# Patient Record
Sex: Male | Born: 1935 | Race: Black or African American | Hispanic: No | Smoking: Never smoker
Health system: Southern US, Community
[De-identification: ages and names within clinical notes are randomized; demographics above are authoritative.]

## PROBLEM LIST (undated history)

## (undated) DIAGNOSIS — I10 Essential (primary) hypertension: Secondary | ICD-10-CM

## (undated) DIAGNOSIS — K219 Gastro-esophageal reflux disease without esophagitis: Secondary | ICD-10-CM

## (undated) DIAGNOSIS — E119 Type 2 diabetes mellitus without complications: Secondary | ICD-10-CM

## (undated) DIAGNOSIS — R011 Cardiac murmur, unspecified: Secondary | ICD-10-CM

## (undated) DIAGNOSIS — C61 Malignant neoplasm of prostate: Secondary | ICD-10-CM

## (undated) DIAGNOSIS — M1990 Unspecified osteoarthritis, unspecified site: Secondary | ICD-10-CM

## (undated) DIAGNOSIS — E7800 Pure hypercholesterolemia, unspecified: Secondary | ICD-10-CM

## (undated) HISTORY — DX: Unspecified osteoarthritis, unspecified site: M19.90

## (undated) HISTORY — PX: PROSTATE SURGERY: SHX751

## (undated) HISTORY — DX: Cardiac murmur, unspecified: R01.1

---

## 2004-08-24 ENCOUNTER — Ambulatory Visit: Payer: Self-pay | Admitting: Urology

## 2005-08-18 IMAGING — CT CT ABDOMEN AND PELVIS WITHOUT AND WITH CONTRAST
2 of 4 series · 14 of 32 positions shown, 19 images · non-contrast
Comparison: none

REASON FOR EXAM: Hematuria
COMMENTS:

[Series 4: with · axial · 0.65mm/px · z∈[-456,-136]mm · 8 of 52 slices shown, 13 images]
[im 6/52  soft-tissue]
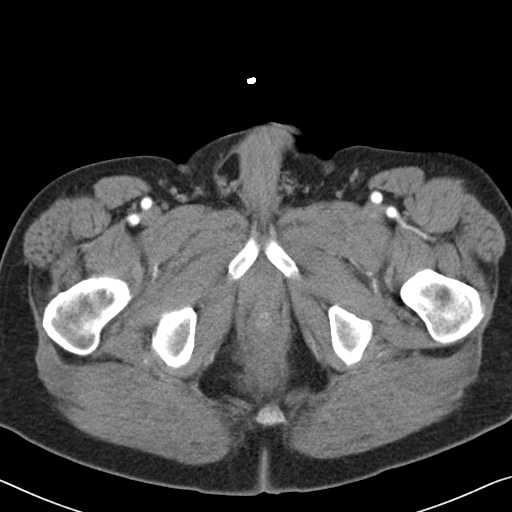
[im 6/52  bone]
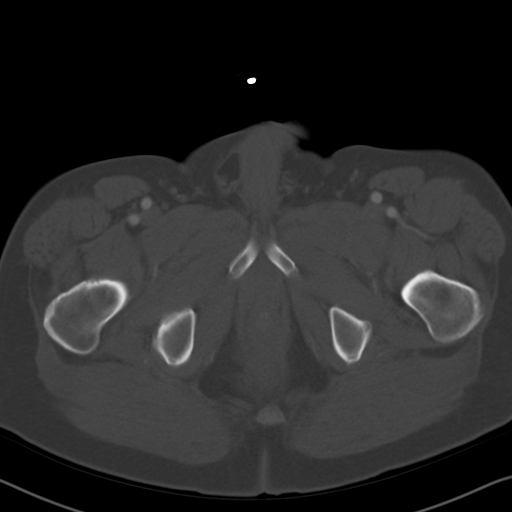
[im 12/52  soft-tissue]
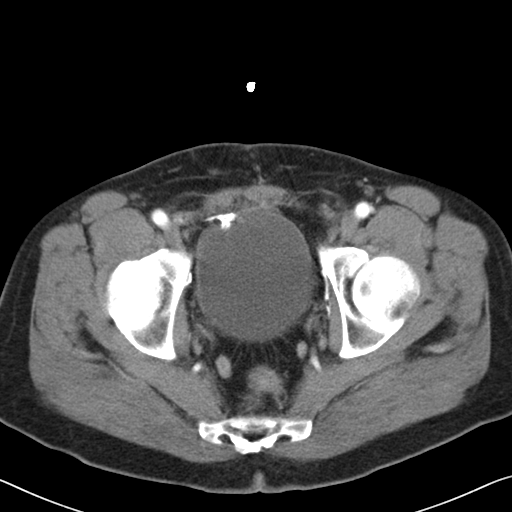
[im 18/52  soft-tissue]
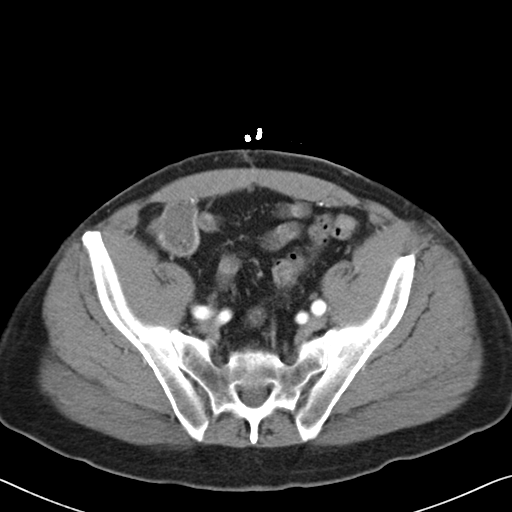
[im 23/52  soft-tissue]
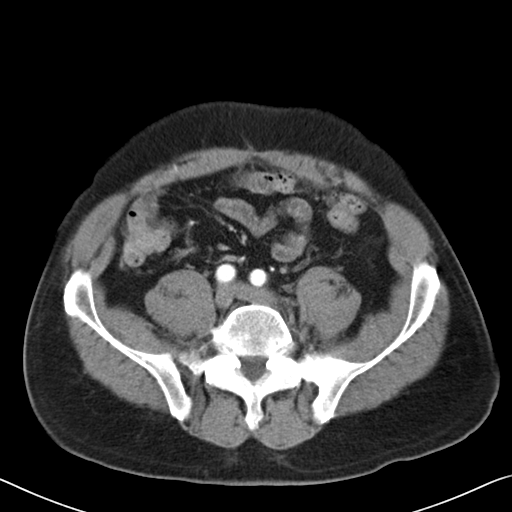
[im 29/52  soft-tissue]
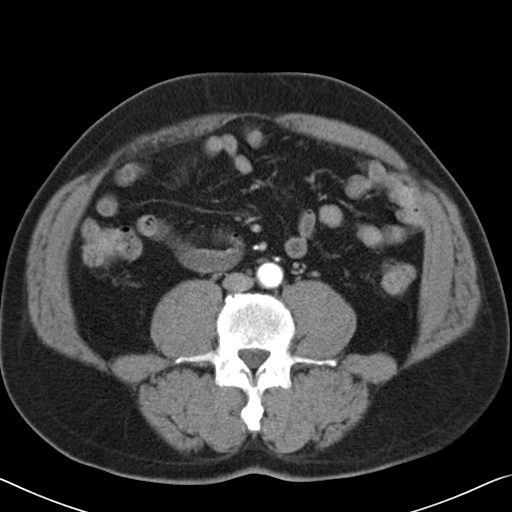
[im 29/52  lung]
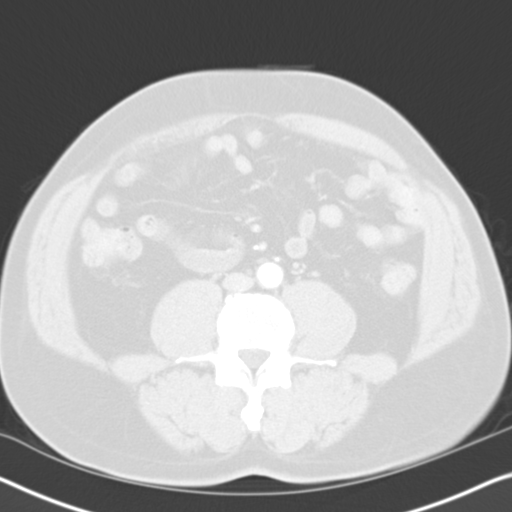
[im 35/52  soft-tissue]
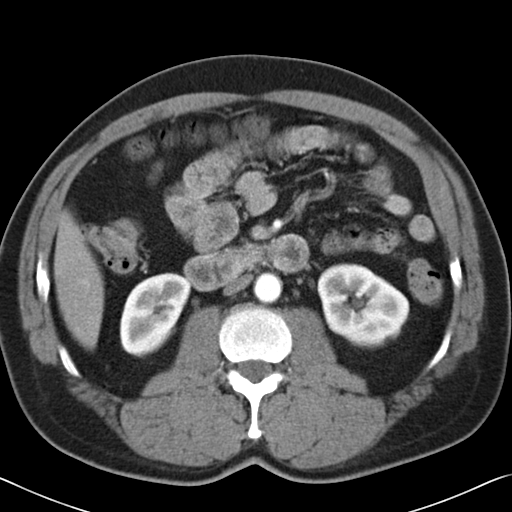
[im 35/52  lung]
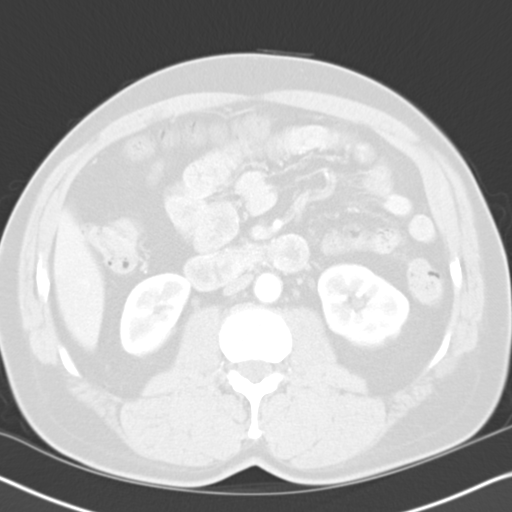
[im 40/52  soft-tissue]
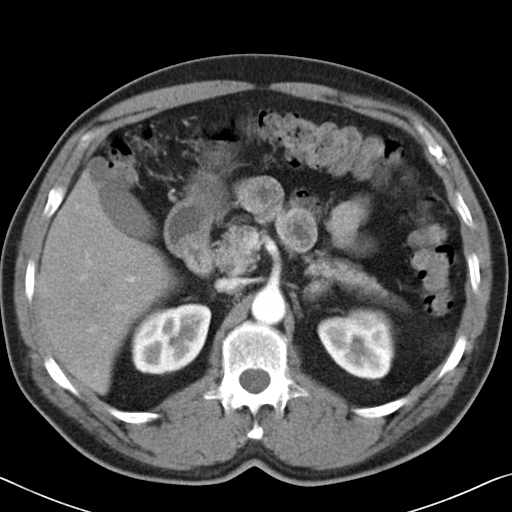
[im 40/52  lung]
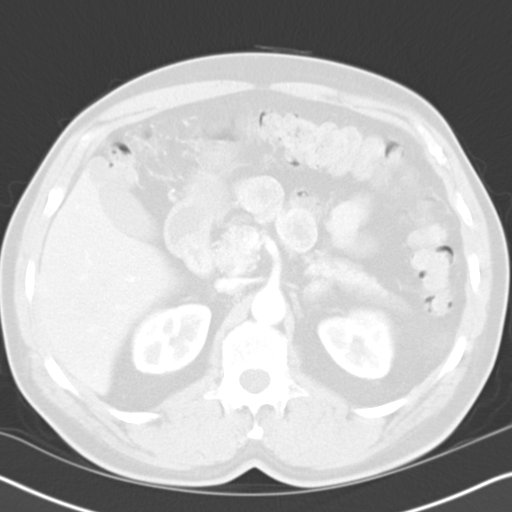
[im 46/52  soft-tissue]
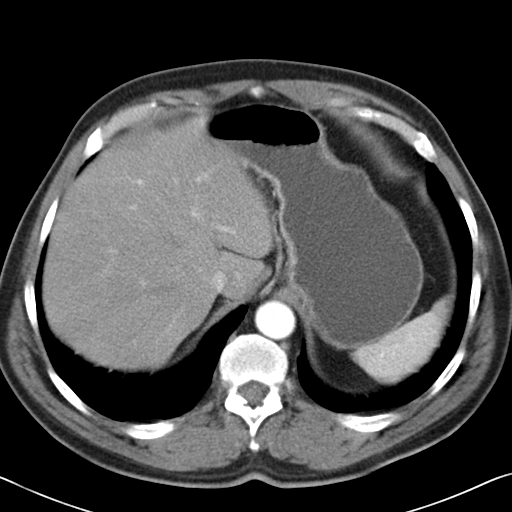
[im 46/52  lung]
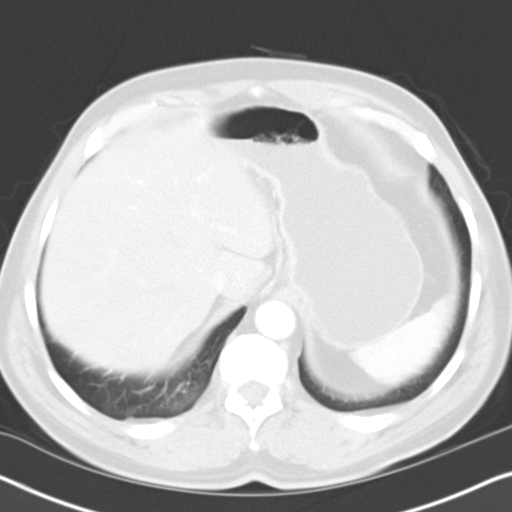

[Series 6: delay · axial · delayed · 0.65mm/px · z∈[-456,-224]mm · 6 of 52 slices shown]
[im 6/52  soft-tissue]
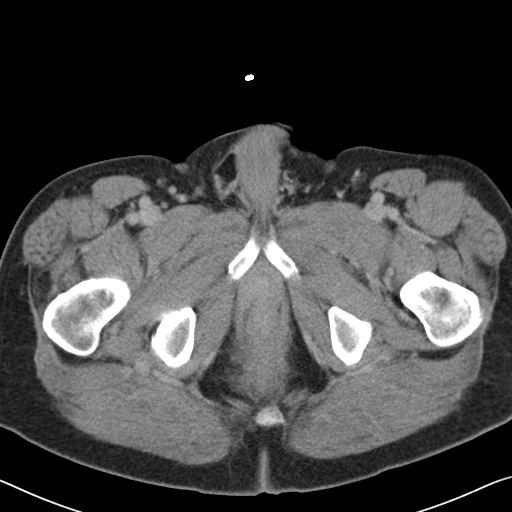
[im 12/52  soft-tissue]
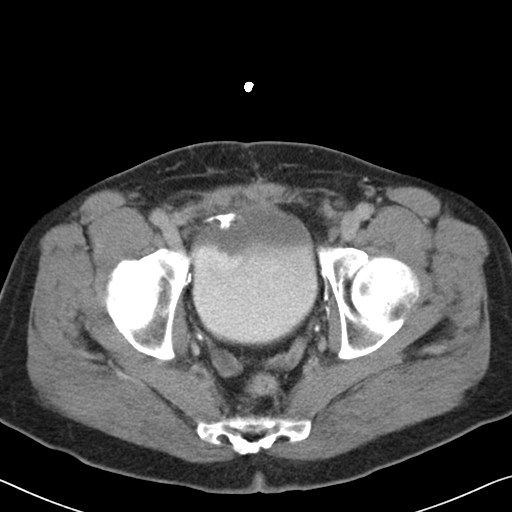
[im 18/52  soft-tissue]
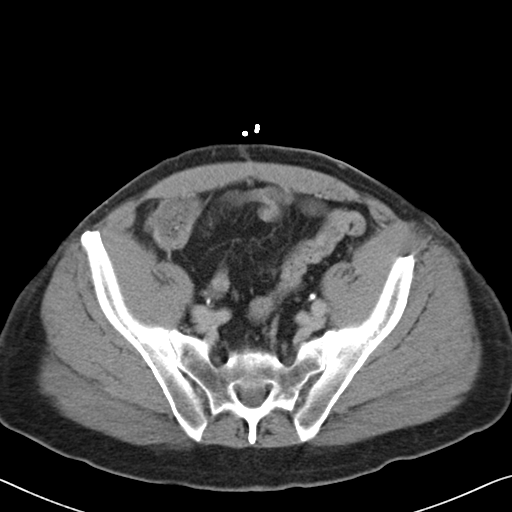
[im 23/52  soft-tissue]
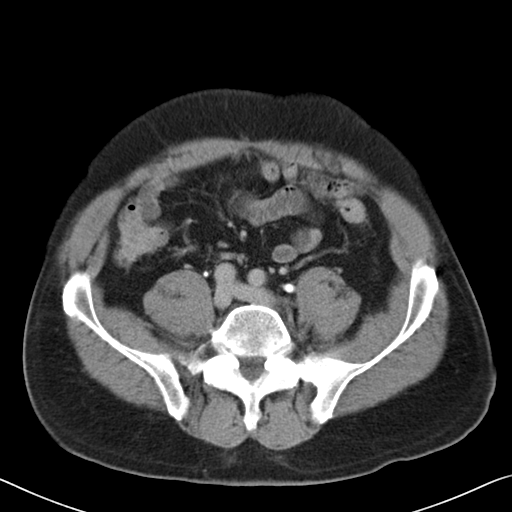
[im 29/52  soft-tissue]
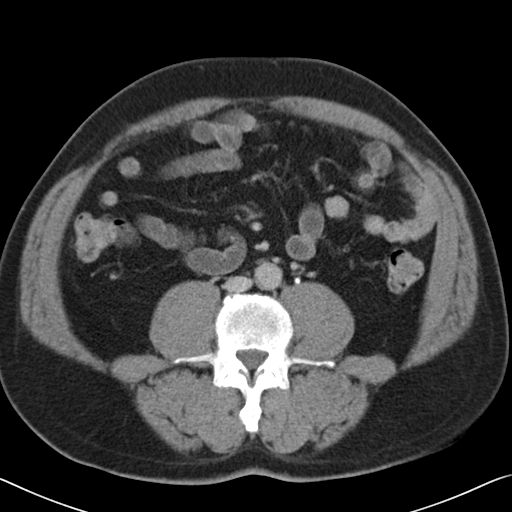
[im 35/52  soft-tissue]
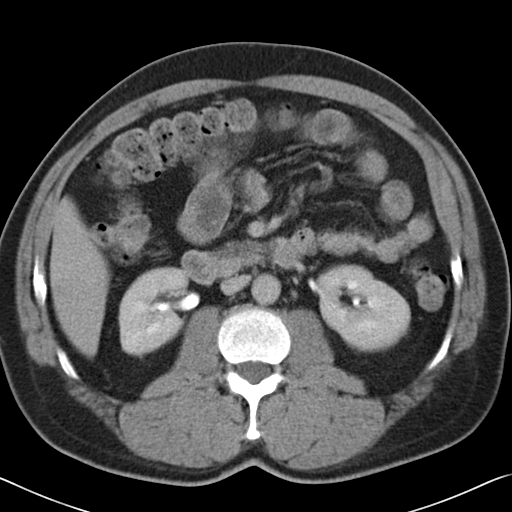

[14 of 32 positions shown; findings below may reference images not displayed]

PROCEDURE:     CT  - CT ABDOMEN / PELVIS  W/WO  - August 24, 2004  [DATE]

RESULT:     Triphasic study of the abdomen and pelvis was performed before
and after administration of 100 cc of Bsovue-DRN contrast.  The
noncontrasted images do not show an obvious solid organ abnormality.  No
free intraperitoneal fluid, air, or adenopathy is noted.  The patient has a
history of prostate cancer with prostatectomy.  After contrast was
administered there is normal enhancement of the liver, spleen, stomach,
gallbladder, pancreas, adrenals, and kidneys.  No suspicious thickening of
bowel loops is identified.  There is no free fluid down either pericolic
gutter.  The aorta tapers normally without evidence of aneurysmal
dilatation.  Cuts through the pelvis show the bladder to distend normally
without obvious filling defect or wall thickening.  No new suspicious lesion
is seen in the prostatectomy bed.  There are no perirectal inflammatory
changes.  The patient does show evidence of a fat-containing RIGHT inguinal
hernia but no evidence of ileus or obstruction is identified.  The delayed
images show normal emptying of contrast into both collecting systems.  There
is no evidence of obstruction.  Bony structures show degenerative change but
I do not see clear evidence of a suspicious lytic or blastic bony lesion.
The lung windows show the lung bases to be clear.
IMPRESSION: No suspicious solid organ abnormality, free intraperitoneal fluid, air, or
adenopathy.

No evidence of nephrolithiasis, hydronephrosis, or paranephric stranding.

Status post prostatectomy with no suspicious enhancing lesion seen in the
prostatectomy.  The bladder distends normally without evidence of filling
defect or wall thickening.

Degenerative bony changes.

## 2005-10-09 ENCOUNTER — Ambulatory Visit: Payer: Self-pay | Admitting: Gastroenterology

## 2007-11-11 ENCOUNTER — Ambulatory Visit: Payer: Self-pay | Admitting: Unknown Physician Specialty

## 2008-02-12 ENCOUNTER — Ambulatory Visit: Payer: Self-pay | Admitting: Internal Medicine

## 2012-12-02 ENCOUNTER — Ambulatory Visit: Payer: Self-pay | Admitting: Gastroenterology

## 2014-09-15 DIAGNOSIS — Z0000 Encounter for general adult medical examination without abnormal findings: Secondary | ICD-10-CM | POA: Diagnosis not present

## 2014-09-15 DIAGNOSIS — E119 Type 2 diabetes mellitus without complications: Secondary | ICD-10-CM | POA: Diagnosis not present

## 2014-09-15 DIAGNOSIS — C61 Malignant neoplasm of prostate: Secondary | ICD-10-CM | POA: Diagnosis not present

## 2014-09-15 DIAGNOSIS — I10 Essential (primary) hypertension: Secondary | ICD-10-CM | POA: Diagnosis not present

## 2014-10-06 DIAGNOSIS — I10 Essential (primary) hypertension: Secondary | ICD-10-CM | POA: Diagnosis not present

## 2014-10-06 DIAGNOSIS — Z79899 Other long term (current) drug therapy: Secondary | ICD-10-CM | POA: Diagnosis not present

## 2014-10-06 DIAGNOSIS — E119 Type 2 diabetes mellitus without complications: Secondary | ICD-10-CM | POA: Diagnosis not present

## 2014-10-06 DIAGNOSIS — Z125 Encounter for screening for malignant neoplasm of prostate: Secondary | ICD-10-CM | POA: Diagnosis not present

## 2014-10-06 DIAGNOSIS — E780 Pure hypercholesterolemia: Secondary | ICD-10-CM | POA: Diagnosis not present

## 2015-04-06 DIAGNOSIS — I10 Essential (primary) hypertension: Secondary | ICD-10-CM | POA: Diagnosis not present

## 2015-04-06 DIAGNOSIS — Z23 Encounter for immunization: Secondary | ICD-10-CM | POA: Diagnosis not present

## 2015-04-06 DIAGNOSIS — Z79899 Other long term (current) drug therapy: Secondary | ICD-10-CM | POA: Diagnosis not present

## 2015-04-06 DIAGNOSIS — E119 Type 2 diabetes mellitus without complications: Secondary | ICD-10-CM | POA: Diagnosis not present

## 2015-04-06 DIAGNOSIS — E7800 Pure hypercholesterolemia, unspecified: Secondary | ICD-10-CM | POA: Diagnosis not present

## 2015-08-01 DIAGNOSIS — H524 Presbyopia: Secondary | ICD-10-CM | POA: Diagnosis not present

## 2015-08-01 DIAGNOSIS — H5210 Myopia, unspecified eye: Secondary | ICD-10-CM | POA: Diagnosis not present

## 2015-09-25 DIAGNOSIS — E119 Type 2 diabetes mellitus without complications: Secondary | ICD-10-CM | POA: Diagnosis not present

## 2015-09-25 DIAGNOSIS — Z125 Encounter for screening for malignant neoplasm of prostate: Secondary | ICD-10-CM | POA: Diagnosis not present

## 2015-09-25 DIAGNOSIS — Z0000 Encounter for general adult medical examination without abnormal findings: Secondary | ICD-10-CM | POA: Diagnosis not present

## 2015-09-25 DIAGNOSIS — Z79899 Other long term (current) drug therapy: Secondary | ICD-10-CM | POA: Diagnosis not present

## 2015-09-25 DIAGNOSIS — E7800 Pure hypercholesterolemia, unspecified: Secondary | ICD-10-CM | POA: Diagnosis not present

## 2015-09-25 DIAGNOSIS — C61 Malignant neoplasm of prostate: Secondary | ICD-10-CM | POA: Diagnosis not present

## 2015-09-25 DIAGNOSIS — I10 Essential (primary) hypertension: Secondary | ICD-10-CM | POA: Diagnosis not present

## 2016-03-27 DIAGNOSIS — G8929 Other chronic pain: Secondary | ICD-10-CM | POA: Diagnosis not present

## 2016-03-27 DIAGNOSIS — Z23 Encounter for immunization: Secondary | ICD-10-CM | POA: Diagnosis not present

## 2016-03-27 DIAGNOSIS — M542 Cervicalgia: Secondary | ICD-10-CM | POA: Diagnosis not present

## 2016-03-27 DIAGNOSIS — E119 Type 2 diabetes mellitus without complications: Secondary | ICD-10-CM | POA: Diagnosis not present

## 2016-03-27 DIAGNOSIS — I10 Essential (primary) hypertension: Secondary | ICD-10-CM | POA: Diagnosis not present

## 2016-03-27 DIAGNOSIS — M47812 Spondylosis without myelopathy or radiculopathy, cervical region: Secondary | ICD-10-CM | POA: Diagnosis not present

## 2016-03-27 DIAGNOSIS — C61 Malignant neoplasm of prostate: Secondary | ICD-10-CM | POA: Diagnosis not present

## 2016-03-27 DIAGNOSIS — Z79899 Other long term (current) drug therapy: Secondary | ICD-10-CM | POA: Diagnosis not present

## 2016-03-27 DIAGNOSIS — E7800 Pure hypercholesterolemia, unspecified: Secondary | ICD-10-CM | POA: Diagnosis not present

## 2016-03-27 DIAGNOSIS — Z125 Encounter for screening for malignant neoplasm of prostate: Secondary | ICD-10-CM | POA: Diagnosis not present

## 2016-08-12 ENCOUNTER — Emergency Department
Admission: EM | Admit: 2016-08-12 | Discharge: 2016-08-12 | Disposition: A | Discharge status: Home / Self Care | Payer: Medicare HMO | Attending: Emergency Medicine | Admitting: Emergency Medicine

## 2016-08-12 ENCOUNTER — Encounter: Payer: Self-pay | Admitting: Intensive Care

## 2016-08-12 DIAGNOSIS — I10 Essential (primary) hypertension: Secondary | ICD-10-CM | POA: Insufficient documentation

## 2016-08-12 DIAGNOSIS — R42 Dizziness and giddiness: Secondary | ICD-10-CM

## 2016-08-12 DIAGNOSIS — Z8546 Personal history of malignant neoplasm of prostate: Secondary | ICD-10-CM | POA: Diagnosis not present

## 2016-08-12 DIAGNOSIS — E119 Type 2 diabetes mellitus without complications: Secondary | ICD-10-CM | POA: Diagnosis not present

## 2016-08-12 HISTORY — DX: Type 2 diabetes mellitus without complications: E11.9

## 2016-08-12 HISTORY — DX: Gastro-esophageal reflux disease without esophagitis: K21.9

## 2016-08-12 HISTORY — DX: Malignant neoplasm of prostate: C61

## 2016-08-12 HISTORY — DX: Pure hypercholesterolemia, unspecified: E78.00

## 2016-08-12 HISTORY — DX: Essential (primary) hypertension: I10

## 2016-08-12 LAB — COMPREHENSIVE METABOLIC PANEL
ALK PHOS: 72 U/L (ref 38–126)
ALT: 12 U/L — ABNORMAL LOW (ref 17–63)
AST: 27 U/L (ref 15–41)
Albumin: 3.8 g/dL (ref 3.5–5.0)
Anion gap: 7 (ref 5–15)
BILIRUBIN TOTAL: 0.7 mg/dL (ref 0.3–1.2)
BUN: 16 mg/dL (ref 6–20)
CALCIUM: 9.4 mg/dL (ref 8.9–10.3)
CO2: 25 mmol/L (ref 22–32)
CREATININE: 1.11 mg/dL (ref 0.61–1.24)
Chloride: 107 mmol/L (ref 101–111)
Glucose, Bld: 150 mg/dL — ABNORMAL HIGH (ref 65–99)
Potassium: 3.6 mmol/L (ref 3.5–5.1)
Sodium: 139 mmol/L (ref 135–145)
TOTAL PROTEIN: 7.1 g/dL (ref 6.5–8.1)

## 2016-08-12 LAB — CBC
HCT: 39.2 % — ABNORMAL LOW (ref 40.0–52.0)
HEMOGLOBIN: 13.9 g/dL (ref 13.0–18.0)
MCH: 32.4 pg (ref 26.0–34.0)
MCHC: 35.6 g/dL (ref 32.0–36.0)
MCV: 91 fL (ref 80.0–100.0)
Platelets: 168 10*3/uL (ref 150–440)
RBC: 4.3 MIL/uL — AB (ref 4.40–5.90)
RDW: 12.8 % (ref 11.5–14.5)
WBC: 5.3 10*3/uL (ref 3.8–10.6)

## 2016-08-12 LAB — TROPONIN I

## 2016-08-12 NOTE — ED Notes (Signed)
Patient normally takes his blood pressure medication in the mornings which he did this morning but he also took a 2nd one this afternoon around 4pm.

## 2016-08-12 NOTE — ED Triage Notes (Signed)
Patient presents to ER with c/o high blood pressure and dizziness since 0000000. Systolic is normally around 140s and patients last pressure at home at 8:10pm was 195/91. A&O x3.

## 2016-08-12 NOTE — ED Provider Notes (Signed)
[facility] Emergency Department Provider Note  Time seen: 10:54 PM  I have reviewed the triage vital signs and the nursing notes.   HISTORY  Chief Complaint Dizziness    HPI Henry Frazier is a 82 y.o. male with a past medical history of diabetes, gastric reflux, hypertension, hyperlipidemia, who presents to the emergency department with dizziness and elevated blood pressure. According to the patient this morning he became somewhat dizzy, he states this happened before at high blood pressure so took his blood pressure which was approximately 99991111 systolic. He states later this afternoon he once again became dizzy and lightheaded. He took an additional dose of blood pressure medication this afternoon but his blood pressure remained approximately 99991111 systolic. States prior to arrival in the emergency department it had increased to [id number] systolic which prompted his ER visit. Denies any chest pain, shortness of breath or nausea. Patient denies any current dizziness.  Past Medical History:  Diagnosis Date  . Acid reflux   . Diabetes mellitus without complication ([place])   . Hypertension   . Prostate cancer ([place])   . Pure hypercholesterolemia     There are no active problems to display for this patient.   Past Surgical History:  Procedure Laterality Date  . PROSTATE SURGERY      Prior to Admission medications   Not on File    Allergies  Allergen Reactions  . Lisinopril     History reviewed. No pertinent family history.  Social History Social History  Substance Use Topics  . Smoking status: Never Smoker  . Smokeless tobacco: Never Used  . Alcohol use No    Review of Systems Constitutional: Negative for fever. Cardiovascular: Negative for chest pain. Respiratory: Negative for shortness of breath. Gastrointestinal: Negative for abdominal pain Neurological: Negative for headache 10-point ROS otherwise  negative.  ____________________________________________   PHYSICAL EXAM:  VITAL SIGNS: ED Triage Vitals  Enc Vitals Group     BP 08/12/16 2054 (!) 212/79     Pulse Rate 08/12/16 2054 (!) 59     Resp 08/12/16 2054 10     Temp 08/12/16 2054 98.5 F (36.9 C)     Temp Source 08/12/16 2054 Oral     SpO2 08/12/16 2054 98 %     Weight 08/12/16 2051 162 lb (73.5 kg)     Height 08/12/16 2051 5\' 6"  (1.676 m)     Head Circumference --      Peak Flow --      Pain Score --      Pain Loc --      Pain Edu? --      Excl. in [place]? --     Constitutional: Alert and oriented. Well appearing and in no distress. Eyes: Normal exam ENT   Head: Normocephalic and atraumatic.   Mouth/Throat: Mucous membranes are moist. Cardiovascular: Normal rate, regular rhythm. No murmur Respiratory: Normal respiratory effort without tachypnea nor retractions. Breath sounds are clear  Gastrointestinal: Soft and nontender. No distention. Musculoskeletal: Nontender with normal range of motion in all extremities. No tenderness or edema. Neurologic:  Normal speech and language. No gross focal neurologic deficits  Skin:  Skin is warm, dry and intact.  Psychiatric: Mood and affect are normal.  ____________________________________________    EKG  EKG reviewed and interpreted by myself shows normal sinus rhythm at 59 bpm, narrow QRS, normal axis, normal intervals, no concerning ST changes.  ____________________________________________   INITIAL IMPRESSION / ASSESSMENT AND PLAN / ED COURSE  Pertinent labs & imaging results that were available during my care of the patient were reviewed by me and considered in my medical decision making (see chart for details).  The patient presents to the emergency department with elevated blood pressure. Patient states dizziness as well. We will check labs. EKG is reassuring. Patient denies any chest pain or headache. Denies any focal weakness or numbness. Patient's exam is  overall normal. Without intervention the patient's current blood pressure is 173/82.  Patient's labs are largely within normal limits. Troponin 0.03. Patient will be discharged home.  ____________________________________________   FINAL CLINICAL IMPRESSION(S) / ED DIAGNOSES  Hypertension Dizziness    Harvest Dark, MD 08/12/16 2311

## 2016-08-13 DIAGNOSIS — I10 Essential (primary) hypertension: Secondary | ICD-10-CM | POA: Diagnosis not present

## 2016-08-15 DIAGNOSIS — I10 Essential (primary) hypertension: Secondary | ICD-10-CM | POA: Diagnosis not present

## 2016-08-21 ENCOUNTER — Ambulatory Visit (INDEPENDENT_AMBULATORY_CARE_PROVIDER_SITE_OTHER): Payer: Medicare HMO | Admitting: Family Medicine

## 2016-08-21 ENCOUNTER — Encounter: Payer: Self-pay | Admitting: Family Medicine

## 2016-08-21 VITALS — BP 156/80 | HR 74 | Temp 98.8°F | Resp 18 | Ht 66.0 in | Wt 157.0 lb

## 2016-08-21 DIAGNOSIS — I10 Essential (primary) hypertension: Secondary | ICD-10-CM

## 2016-08-21 DIAGNOSIS — E119 Type 2 diabetes mellitus without complications: Secondary | ICD-10-CM | POA: Diagnosis not present

## 2016-08-21 DIAGNOSIS — R0989 Other specified symptoms and signs involving the circulatory and respiratory systems: Secondary | ICD-10-CM

## 2016-08-21 DIAGNOSIS — M542 Cervicalgia: Secondary | ICD-10-CM | POA: Diagnosis not present

## 2016-08-21 LAB — POCT URINALYSIS DIP (MANUAL ENTRY)
BILIRUBIN UA: NEGATIVE
BILIRUBIN UA: NEGATIVE
GLUCOSE UA: NEGATIVE
LEUKOCYTES UA: NEGATIVE
NITRITE UA: NEGATIVE
PH UA: 5
Protein Ur, POC: NEGATIVE
Spec Grav, UA: >=1.03
Urobilinogen, UA: 0.2

## 2016-08-21 NOTE — Progress Notes (Signed)
Subjective:    Patient ID: Henry Frazier, male    DOB: 29-Jul-1934, 82 y.o.   MRN: [id number]  08/21/2016  Follow-up (BP check ) and Neck Pain   HPI This 82 y.o. male presents with granddaughter to establish care versus second opinion and specifically for evaluation of hypertension and neck pain. Two weeks ago, started having elevation of blood pressures; BP readings have been ranging 140-180/70-90.  Pt developed dizziness on  08/12/16 and granddaughter took pt to [facility] ED; BP was 172/94.  S/p EKG that revealed NSR, no concerning ST changes, Troponin that was negative at 0.03, and BMET that was overall normal.  Pt was discharged home and advised to follow-up with primary care proivder.  Follow-up the following day with Dr. Fulton Reek of [facility]; BP 220/100 on 08/13/16 at Dr. Doy Hutching office.  Dr. Doy Hutching continued Atenolol and increased Losartan to bid and added Clonidine 0.2mg  bid.  Follow-up in 48 hours.  On 08/15/16, blood pressure 154/78 at Dr. Doy Hutching office.  Pt complained of bradycardia and fatigue at that visit.  Management changes included changing Clonidine to PRN only.  Pt also advised by an on-call physician for [facility] to stop taking Meloxicam.  Pt had been taking Meloxicam daily for past 6 months for DDD cervical spine; stopped Meloxicam five days ago and blood pressure have improved.  Now, suffering with persistent neck pain that was well controlled with Meloxicam.  Denies headaches, persistent dizziness, blurred vision, chest pain, palpitations, SOB, leg swelling.   Has been taking ASA 500mg  daily to bid for neck pain since stopping Meloxicam.    BP Readings from Last 3 Encounters:  08/21/16 (!) 156/80  08/12/16 (!) 160/72   160/84, 142/78.  Sugars have been under good control; fasting sugars running 106.  Last hemoglobin A1c was excellent.  Previously took Amlodipine but thinks that cardiologist stopped medication in the past with past cardiology consultation years ago.  No  history of CAD or CVA. Exercises regularly.  No urinary incontinence; does not recall taking diuretic or HCTZ in the past.     Review of Systems  Constitutional: Negative for activity change, appetite change, chills, diaphoresis, fatigue and fever.  Eyes: Negative for visual disturbance.  Respiratory: Negative for cough and shortness of breath.   Cardiovascular: Negative for chest pain, palpitations and leg swelling.  Endocrine: Negative for cold intolerance, heat intolerance, polydipsia, polyphagia and polyuria.  Musculoskeletal: Positive for neck pain and neck stiffness.  Neurological: Negative for dizziness, tremors, seizures, syncope, facial asymmetry, speech difficulty, weakness, light-headedness, numbness and headaches.  Psychiatric/Behavioral: Negative for behavioral problems.    Past Medical History:  Diagnosis Date  . Acid reflux   . Arthritis   . Diabetes mellitus without complication ([place])   . Hypertension   . Prostate cancer ([place])   . Pure hypercholesterolemia    Past Surgical History:  Procedure Laterality Date  . PROSTATE SURGERY     Allergies  Allergen Reactions  . Lisinopril    Current Outpatient Prescriptions  Medication Sig Dispense Refill  . atenolol (TENORMIN) 25 MG tablet Take by mouth daily.    . cetirizine (ZYRTEC) 10 MG tablet Take 10 mg by mouth daily.    . cloNIDine (CATAPRES) 0.2 MG tablet Take 0.2 mg by mouth 2 (two) times daily.    Marland Kitchen glucose blood test strip 1 each by Other route as needed for other. Use as instructed    . losartan (COZAAR) 50 MG tablet Take 50 mg by mouth daily.    Marland Kitchen  lovastatin (MEVACOR) 40 MG tablet Take 40 mg by mouth at bedtime.    . meloxicam (MOBIC) 7.5 MG tablet Take 7.5 mg by mouth daily.    . metFORMIN (GLUCOPHAGE-XR) 750 MG 24 hr tablet Take 750 mg by mouth daily with breakfast.    . omeprazole (PRILOSEC) 20 MG capsule Take 20 mg by mouth daily.     No current facility-administered medications for this visit.     Social History   Social History  . Marital status: Married    Spouse name: N/A  . Number of children: N/A  . Years of education: N/A   Occupational History  . Not on file.   Social History Main Topics  . Smoking status: Never Smoker  . Smokeless tobacco: Never Used  . Alcohol use No  . Drug use: Unknown  . Sexual activity: Not on file   Other Topics Concern  . Not on file   Social History Narrative  . No narrative on file   History reviewed. No pertinent family history.     Objective:    BP (!) 156/80   Pulse 74   Temp 98.8 F (37.1 C) (Oral)   Resp 18   Ht 5\' 6"  (1.676 m)   Wt 157 lb (71.2 kg)   SpO2 97%   BMI 25.34 kg/m  Physical Exam  Constitutional: He is oriented to person, place, and time. He appears well-developed and well-nourished. No distress.  HENT:  Head: Normocephalic and atraumatic.  Right Ear: External ear normal.  Left Ear: External ear normal.  Nose: Nose normal.  Mouth/Throat: Oropharynx is clear and moist.  Eyes: Conjunctivae and EOM are normal. Pupils are equal, round, and reactive to light.  Neck: Normal range of motion. Neck supple. Carotid bruit is not present. No thyromegaly present.  Cardiovascular: Normal rate, regular rhythm, normal heart sounds and intact distal pulses.  Exam reveals no gallop and no friction rub.   No murmur heard. Pulmonary/Chest: Effort normal and breath sounds normal. He has no wheezes. He has no rales.  Abdominal: Soft. Bowel sounds are normal. He exhibits no distension and no mass. There is no tenderness. There is no rebound and no guarding.  Lymphadenopathy:    He has no cervical adenopathy.  Neurological: He is alert and oriented to person, place, and time. No cranial nerve deficit. He exhibits normal muscle tone. Coordination normal.  Skin: Skin is warm and dry. No rash noted. He is not diaphoretic.  Psychiatric: He has a normal mood and affect. His behavior is normal. Judgment and thought content  normal.  Nursing note and vitals reviewed.  Results for orders placed or performed in visit on 08/21/16  POCT urinalysis dipstick  Result Value Ref Range   Color, UA yellow yellow   Clarity, UA clear clear   Glucose, UA negative negative   Bilirubin, UA negative negative   Ketones, POC UA negative negative   Spec Grav, UA >=1.030    Blood, UA trace-intact (A) negative   pH, UA 5.0    Protein Ur, POC negative negative   Urobilinogen, UA 0.2    Nitrite, UA Negative Negative   Leukocytes, UA Negative Negative       Assessment & Plan:   1. Essential hypertension   2. Unequal blood pressure in upper extremities   3. Type 2 diabetes mellitus without complication, without long-term current use of insulin ([place])   4. Neck pain    -blood pressure variation between upper extremities by 15-20.  Recommend checking B arms at home; refer to cardiology for further evaluation. -increased Losartan 50mg  to bid one wek ago; anticipate improvement in blood pressure in upcoming 1-2 weeks with this recent adjustment.   -asymptomatic at this time; thus, monitor.  Obtain u/a.  Check BP daily at home. -recommend continuing Clonidine 0.2mg  bid scheduled in addition to Losartan 50mg  bid. -decrease ASA to 81mg  daily; recommend Tylenol as needed for neck pain.   Orders Placed This Encounter  Procedures  . Ambulatory referral to Cardiology    Referral Priority:   Routine    Referral Type:   Consultation    Referral Reason:   Specialty Services Required    Requested Specialty:   Cardiology    Number of Visits Requested:   1  . POCT urinalysis dipstick   Meds ordered this encounter  Medications  . atenolol (TENORMIN) 25 MG tablet    Sig: Take by mouth daily.  . cetirizine (ZYRTEC) 10 MG tablet    Sig: Take 10 mg by mouth daily.  . cloNIDine (CATAPRES) 0.2 MG tablet    Sig: Take 0.2 mg by mouth 2 (two) times daily.  Marland Kitchen losartan (COZAAR) 50 MG tablet    Sig: Take 50 mg by mouth daily.  Marland Kitchen  lovastatin (MEVACOR) 40 MG tablet    Sig: Take 40 mg by mouth at bedtime.  . meloxicam (MOBIC) 7.5 MG tablet    Sig: Take 7.5 mg by mouth daily.  . metFORMIN (GLUCOPHAGE-XR) 750 MG 24 hr tablet    Sig: Take 750 mg by mouth daily with breakfast.  . omeprazole (PRILOSEC) 20 MG capsule    Sig: Take 20 mg by mouth daily.  Marland Kitchen glucose blood test strip    Sig: 1 each by Other route as needed for other. Use as instructed    Return in about 2 weeks (around 09/04/2016) for recheck BLOOD PRESSURE.   Jalik Gellatly Elayne Guerin, M.D. Primary Care at [facility] previously [facility] [place] [address], [place]  [address] [phone] phone [phone] fax

## 2016-08-21 NOTE — Patient Instructions (Addendum)
CHECK  BLOOD PRESSURES IN BOTH ARMS AND WRITE DOWN EACH DAY.  Continue Losartan 50mg  twice daily. Continue Clonidine 0.2mg  twice daily  TYLENOL 500MG  1-2 TABLETS THREE TIMES DAILY. STOP ASPIRIN 500MG  AT BEDTIME; RETURN TO BABY ASPIRIN.   IF you received an x-ray today, you will receive an invoice from [facility]. Please contact [facility] at [phone] with questions or concerns regarding your invoice.   IF you received labwork today, you will receive an invoice from [facility]. Please contact [facility] at [phone] with questions or concerns regarding your invoice.   Our billing staff will not be able to assist you with questions regarding bills from these companies.  You will be contacted with the lab results as soon as they are available. The fastest way to get your results is to activate your My Chart account. Instructions are located on the last page of this paperwork. If you have not heard from [place] regarding the results in 2 weeks, please contact this office.

## 2016-08-29 DIAGNOSIS — E7800 Pure hypercholesterolemia, unspecified: Secondary | ICD-10-CM | POA: Diagnosis not present

## 2016-08-29 DIAGNOSIS — I10 Essential (primary) hypertension: Secondary | ICD-10-CM | POA: Diagnosis not present

## 2016-08-29 DIAGNOSIS — E119 Type 2 diabetes mellitus without complications: Secondary | ICD-10-CM | POA: Diagnosis not present

## 2016-09-02 ENCOUNTER — Encounter: Payer: Self-pay | Admitting: Family Medicine

## 2016-09-03 ENCOUNTER — Ambulatory Visit: Payer: Self-pay | Admitting: Cardiovascular Disease

## 2016-09-04 ENCOUNTER — Ambulatory Visit: Payer: Self-pay | Admitting: Family Medicine

## 2016-09-05 ENCOUNTER — Ambulatory Visit: Payer: Medicare HMO

## 2016-09-11 NOTE — Progress Notes (Signed)
Cardiology Office Note  Date:  09/12/2016   ID:  Henry Frazier, DOB [birth year], MRN [id number]  PCP:  Reginia Forts, MD   Chief Complaint  Patient presents with  . other    Ref by Dr. Reginia Forts for HTN. Pt. c/o dizziness, elevated blood pressure with occas. irreg. heart beats and shortness of breath. Meds reviewed by the pt. verbally.     HPI:  Henry Frazier is a 82 y.o. male with a past medical history of diabetes, gastric reflux, hypertension, hyperlipidemia, who presents By referral from Dr. Doy Hutching for consultation of his labile hypertension and dizziness History of palpitations well controlled on atenolol  He presents with his granddaughter Numerous medication changes made recently and over the years for hypertension He reports being placed on atenolol 20 years ago with amlodipine and this controlled his palpitations Attempts to wean off the atenolol have resulted in dramatic palpitations No previous Holter or event monitor but symptoms date back at least 20 years  Recently having issues with labile blood pressure On and off clonidine now back on On and off amlodipine now back on Recent increase in losartan up to 50 mg twice a day  He is tolerating metformin, lovastatin He did go to the emergency room recently for severe hypertension systolic pressure more than 190 No medication changes made there, they monitored him and sent him home Reports having dizziness when blood pressure is very high  Also reports having episodes of vertigo Happens when he moves his head side-to-side or up and down, bending over and standing up quickly  When asked about fatigue, he denies any significant problems No dry mouth, feels fine on his current medication regimen  When asked about his neck pain, he is not requiring meloxicam. He takes Tylenol when necessary  Lab work reviewed with him in detail HBA1C 6.8 Total chol 174, LDL108  EKG on today's visit shows sinus bradycardia rate 50  bpm first degree AV block no significant ST or T-wave changes  PMH:   has a past medical history of Acid reflux; Arthritis; Diabetes mellitus without complication ([place]); Hypertension; Prostate cancer ([place]); and Pure hypercholesterolemia.  PSH:    Past Surgical History:  Procedure Laterality Date  . PROSTATE SURGERY      Current Outpatient Prescriptions  Medication Sig Dispense Refill  . acetaminophen (TYLENOL) 500 MG tablet Take 500 mg by mouth every 6 (six) hours as needed.    Marland Kitchen amLODipine (NORVASC) 5 MG tablet Take 1 tablet (5 mg total) by mouth 2 (two) times daily as needed. 60 tablet 11  . aspirin EC 81 MG tablet Take 81 mg by mouth daily.    Marland Kitchen atenolol (TENORMIN) 25 MG tablet Take 25 mg by mouth 2 (two) times daily.     . cetirizine (ZYRTEC) 10 MG tablet Take 10 mg by mouth daily.    . cloNIDine (CATAPRES) 0.2 MG tablet Take 0.2 mg by mouth 2 (two) times daily.    Marland Kitchen glucose blood test strip 1 each by Other route as needed for other. Use as instructed    . losartan (COZAAR) 50 MG tablet Take 1 tablet (50 mg total) by mouth 2 (two) times daily.    Marland Kitchen lovastatin (MEVACOR) 40 MG tablet Take 40 mg by mouth at bedtime.    . metFORMIN (GLUCOPHAGE-XR) 750 MG 24 hr tablet Take 750 mg by mouth daily with breakfast.    . omeprazole (PRILOSEC) 20 MG capsule Take 20 mg by mouth daily.    Marland Kitchen  meclizine (ANTIVERT) 25 MG tablet Take 1 tablet (25 mg total) by mouth 3 (three) times daily as needed. 60 tablet 3   No current facility-administered medications for this visit.      Allergies:   Lisinopril   Social History:  The patient  reports that he has never smoked. He has never used smokeless tobacco. He reports that he does not drink alcohol.   Family History:   family history includes Colon cancer in his mother; Heart attack (age of onset: 26) in his father.    Review of Systems: Review of Systems  Constitutional: Negative.   Respiratory: Negative.   Cardiovascular: Negative.    Gastrointestinal: Negative.   Musculoskeletal: Positive for neck pain.  Neurological: Negative.   Psychiatric/Behavioral: Negative.   All other systems reviewed and are negative.    PHYSICAL EXAM: VS:  BP (!) 142/68 (BP Location: Right Arm, Patient Position: Sitting, Cuff Size: Normal)   Pulse (!) 50   Ht 5\' 6"  (1.676 m)   Wt 157 lb 8 oz (71.4 kg)   BMI 25.42 kg/m  , BMI Body mass index is 25.42 kg/m. GEN: Well nourished, well developed, in no acute distress  HEENT: normal  Neck: no JVD, carotid bruits, or masses Cardiac: RRR; no murmurs, rubs, or gallops,no edema  Respiratory:  clear to auscultation bilaterally, normal work of breathing GI: soft, nontender, nondistended, + BS MS: no deformity or atrophy  Skin: warm and dry, no rash Neuro:  Strength and sensation are intact Psych: euthymic mood, full affect    Recent Labs: 08/12/2016: ALT 12; BUN 16; Creatinine, Ser 1.11; Hemoglobin 13.9; Platelets 168; Potassium 3.6; Sodium 139    Lipid Panel No results found for: CHOL, HDL, LDLCALC, TRIG    Wt Readings from Last 3 Encounters:  09/12/16 157 lb 8 oz (71.4 kg)  08/21/16 157 lb (71.2 kg)  08/12/16 162 lb (73.5 kg)       ASSESSMENT AND PLAN:  Essential hypertension - Plan: EKG 12-Lead Long discussion concerning his hypertension and management We went through each one of the medications, discussed benefits and side effects We have recommended the protocol as below: For symptoms of fatigue and dry mouth, Please cut the clonidine in half twice a day and increase amlodipine up to 10 mg daily For symptoms of palpitations or tachycardia Please take extra atenolol For high blood pressure beyond your normal range (>170) Please take extra amlodipine Recheck blood pressure 2 hours later If blood pressure continues to run high Take extra half clonidine For symptoms of fatigue and significant bradycardia/slow heart rate Cut the atenolol in half or skip 1 day of  atenolol If symptoms persist, call the office We could potentially decrease clonidine dose in half twice a day with increase in amlodipine up to 10 mg daily  Mixed hyperlipidemia - Plan: EKG 12-Lead Recommended he continue on lovastatin We did discuss screening studies such as CT coronary calcium scoring. He is not particularly interested at this time  Gastroesophageal reflux disease without esophagitis - Plan: EKG 12-Lead Reports GERD symptoms are relatively well controlled on proton pump inhibitor  Type 2 diabetes mellitus without complication, without long-term current use of insulin ([facility]) - Plan: EKG 12-Lead Relatively well-controlled hemoglobin A1c  Prostate cancer ([place]) - Plan: EKG 12-Lead  Notes reviewed from Dr. Doy Hutching, Dr. Tamala Julian, Hospital records reviewed Long discussion with patient concerning above  Total encounter time more than 60 minutes  Greater than 50% was spent in counseling and coordination of care with  the patient   Disposition:   F/U  6 months   Orders Placed This Encounter  Procedures  . EKG 12-Lead     Signed, Esmond Plants, M.D., Ph.D. 09/12/2016  [place], [place]

## 2016-09-12 ENCOUNTER — Ambulatory Visit (INDEPENDENT_AMBULATORY_CARE_PROVIDER_SITE_OTHER): Payer: Medicare HMO | Admitting: Cardiovascular Disease

## 2016-09-12 ENCOUNTER — Encounter: Payer: Self-pay | Admitting: Cardiovascular Disease

## 2016-09-12 VITALS — BP 142/68 | HR 50 | Ht 66.0 in | Wt 157.5 lb

## 2016-09-12 DIAGNOSIS — C61 Malignant neoplasm of prostate: Secondary | ICD-10-CM | POA: Insufficient documentation

## 2016-09-12 DIAGNOSIS — K219 Gastro-esophageal reflux disease without esophagitis: Secondary | ICD-10-CM | POA: Insufficient documentation

## 2016-09-12 DIAGNOSIS — E119 Type 2 diabetes mellitus without complications: Secondary | ICD-10-CM

## 2016-09-12 DIAGNOSIS — E782 Mixed hyperlipidemia: Secondary | ICD-10-CM | POA: Insufficient documentation

## 2016-09-12 DIAGNOSIS — I10 Essential (primary) hypertension: Secondary | ICD-10-CM | POA: Diagnosis not present

## 2016-09-12 MED ORDER — MECLIZINE HCL 25 MG PO TABS: 25.0000 mg | ORAL_TABLET | Freq: Three times a day (TID) | ORAL | 3 refills | Status: DC | PRN

## 2016-09-12 MED ORDER — AMLODIPINE BESYLATE 5 MG PO TABS: 5.0000 mg | ORAL_TABLET | Freq: Two times a day (BID) | ORAL | 11 refills | Status: AC | PRN

## 2016-09-12 NOTE — Patient Instructions (Addendum)
Medication Instructions:   Please continue on your current medications  For symptoms of fatigue and dry mouth, Please cut the clonidine in half twice a day and increase amlodipine up to 10 mg daily  For symptoms of palpitations or tachycardia Please take extra atenolol  For high blood pressure beyond your normal range (>170) Please take extra amlodipine Recheck blood pressure 2 hours later If blood pressure continues to run high Take extra half clonidine  For symptoms of fatigue and significant bradycardia/slow heart rate Cut the atenolol in half or skip 1 day of atenolol If symptoms persist, call the office We could potentially decrease clonidine dose in half twice a day with increase in amlodipine up to 10 mg daily  Labwork:  No new labs needed  Testing/Procedures:  Research CT coronary calcium score  $150  Follow-Up: It was a pleasure seeing you in the office today. Please call us if you have new issues that need to be addressed before your next appt.  [phone]  Your physician wants you to follow-up in: as needed  If you need a refill on your cardiac medications before your next appointment, please call your pharmacy.

## 2016-09-13 ENCOUNTER — Telehealth: Payer: Self-pay | Admitting: Cardiovascular Disease

## 2016-09-13 NOTE — Telephone Encounter (Signed)
Pt has been approved for Meclizine HCL.  Approved on March 22  Questionnaire submitted. PA Case [id number] Status: Approved.

## 2016-09-13 NOTE — Telephone Encounter (Signed)
Approved until 09/13/18.

## 2016-10-25 DIAGNOSIS — Z79899 Other long term (current) drug therapy: Secondary | ICD-10-CM | POA: Diagnosis not present

## 2016-10-25 DIAGNOSIS — C61 Malignant neoplasm of prostate: Secondary | ICD-10-CM | POA: Diagnosis not present

## 2016-10-25 DIAGNOSIS — E119 Type 2 diabetes mellitus without complications: Secondary | ICD-10-CM | POA: Diagnosis not present

## 2016-10-25 DIAGNOSIS — I10 Essential (primary) hypertension: Secondary | ICD-10-CM | POA: Diagnosis not present

## 2016-10-25 DIAGNOSIS — E7800 Pure hypercholesterolemia, unspecified: Secondary | ICD-10-CM | POA: Diagnosis not present

## 2016-10-25 DIAGNOSIS — Z125 Encounter for screening for malignant neoplasm of prostate: Secondary | ICD-10-CM | POA: Diagnosis not present

## 2016-10-25 DIAGNOSIS — Z0000 Encounter for general adult medical examination without abnormal findings: Secondary | ICD-10-CM | POA: Diagnosis not present

## 2016-12-04 DIAGNOSIS — I10 Essential (primary) hypertension: Secondary | ICD-10-CM | POA: Diagnosis not present

## 2016-12-04 DIAGNOSIS — E7800 Pure hypercholesterolemia, unspecified: Secondary | ICD-10-CM | POA: Diagnosis not present

## 2016-12-04 DIAGNOSIS — E119 Type 2 diabetes mellitus without complications: Secondary | ICD-10-CM | POA: Diagnosis not present

## 2016-12-04 DIAGNOSIS — Z79899 Other long term (current) drug therapy: Secondary | ICD-10-CM | POA: Diagnosis not present

## 2017-01-06 ENCOUNTER — Other Ambulatory Visit: Payer: Self-pay | Admitting: Cardiovascular Disease

## 2017-01-29 ENCOUNTER — Ambulatory Visit
Admission: RE | Admit: 2017-01-29 | Discharge: 2017-01-29 | Disposition: A | Discharge status: Home / Self Care | Payer: Medicare HMO | Source: Ambulatory Visit | Attending: Physician Assistant | Admitting: Physician Assistant

## 2017-01-29 ENCOUNTER — Other Ambulatory Visit: Payer: Self-pay | Admitting: Physician Assistant

## 2017-01-29 DIAGNOSIS — I10 Essential (primary) hypertension: Secondary | ICD-10-CM | POA: Diagnosis not present

## 2017-01-29 DIAGNOSIS — M79605 Pain in left leg: Secondary | ICD-10-CM | POA: Diagnosis not present

## 2017-01-29 DIAGNOSIS — M7989 Other specified soft tissue disorders: Secondary | ICD-10-CM | POA: Diagnosis not present

## 2017-01-29 DIAGNOSIS — M25475 Effusion, left foot: Secondary | ICD-10-CM

## 2017-01-29 DIAGNOSIS — R600 Localized edema: Secondary | ICD-10-CM | POA: Diagnosis not present

## 2017-03-07 DIAGNOSIS — Z79899 Other long term (current) drug therapy: Secondary | ICD-10-CM | POA: Diagnosis not present

## 2017-03-07 DIAGNOSIS — I10 Essential (primary) hypertension: Secondary | ICD-10-CM | POA: Diagnosis not present

## 2017-03-07 DIAGNOSIS — E119 Type 2 diabetes mellitus without complications: Secondary | ICD-10-CM | POA: Diagnosis not present

## 2017-03-07 DIAGNOSIS — E7800 Pure hypercholesterolemia, unspecified: Secondary | ICD-10-CM | POA: Diagnosis not present

## 2017-04-08 DIAGNOSIS — Z23 Encounter for immunization: Secondary | ICD-10-CM | POA: Diagnosis not present

## 2017-05-12 ENCOUNTER — Other Ambulatory Visit: Payer: Self-pay | Admitting: Cardiovascular Disease

## 2017-07-09 DIAGNOSIS — E119 Type 2 diabetes mellitus without complications: Secondary | ICD-10-CM | POA: Diagnosis not present

## 2017-07-09 DIAGNOSIS — E7800 Pure hypercholesterolemia, unspecified: Secondary | ICD-10-CM | POA: Diagnosis not present

## 2017-07-09 DIAGNOSIS — Z79899 Other long term (current) drug therapy: Secondary | ICD-10-CM | POA: Diagnosis not present

## 2017-07-09 DIAGNOSIS — I10 Essential (primary) hypertension: Secondary | ICD-10-CM | POA: Diagnosis not present

## 2017-07-09 DIAGNOSIS — I495 Sick sinus syndrome: Secondary | ICD-10-CM | POA: Diagnosis not present

## 2017-08-21 ENCOUNTER — Ambulatory Visit: Payer: Medicare HMO | Admitting: Internal Medicine

## 2017-08-21 ENCOUNTER — Encounter: Payer: Self-pay | Admitting: Internal Medicine

## 2017-08-21 VITALS — BP 130/70 | HR 48 | Ht 66.0 in | Wt 163.0 lb

## 2017-08-21 DIAGNOSIS — R001 Bradycardia, unspecified: Secondary | ICD-10-CM

## 2017-08-21 DIAGNOSIS — I495 Sick sinus syndrome: Secondary | ICD-10-CM

## 2017-08-21 NOTE — Patient Instructions (Signed)
Medication Instructions: - Your physician recommends that you continue on your current medications as directed. Please refer to the Current Medication list given to you today.  Labwork: - none ordered  Procedures/Testing: - Your physician has requested that you have an exercise tolerance test (on a morning Dr. Caryl Comes is in the office). For further information please visit [email or web address]. Please also follow instruction sheet, as given.  - You may have a light breakfast the morning of your test - No caffeine for 24 hours prior to your test (coffee/ tea/ soft drinks/ chocholate) - No smoking for 24 hour prior to your test - You may take your regular medications the morning of your procedure - Please wear comfortable clothes and tennis shoes the day of your test  Follow-Up: - pending results of your treadmill test  Any Additional Special Instructions Will Be Listed Below (If Applicable).     If you need a refill on your cardiac medications before your next appointment, please call your pharmacy.

## 2017-08-21 NOTE — Progress Notes (Signed)
ELECTROPHYSIOLOGY CONSULT NOTE  Patient ID: Henry Frazier, MRN: [id number], DOB/AGE: Mar 12, 1935 83 y.o. Admit date: (Not on file) Date of Consult: 08/21/2017  Primary Physician: Idelle Crouch, MD Primary Cardiologist: new     Henry Frazier is a 83 y.o. male who is being seen today for the evaluation of sinus bradycardia at the request of Dr Cherly Hensen.    HPI Henry Frazier is a 83 y.o. male  Referred because of bradycardia noted on electrocardiogram.  The heart rate was 48.  The patient has had symptoms of fatigue.  This is not appreciably different over the last year or 2.  Denies lightheadedness, syncope or presyncope.  He has no chest discomfort.  Reviewing the old data sets, he was seen at [facility] with a heart rate of 50 when  he was seen last year  by Dr. Deidre Ala.  He does not know that he snore.  But he does have daytime somnolence falling asleep watching TV and reading   He was on atenolol and there was discordant information as to whether he was taking it or not.  Clarified that he is taking at 25 twice daily  1/19  TSH  1.143 hgb 12.9 Cr 1.3 K 4.3  Past Medical History:  Diagnosis Date  . Acid reflux   . Arthritis   . Diabetes mellitus without complication ([place])   . Heart murmur   . Hypertension   . Prostate cancer ([place])   . Pure hypercholesterolemia       Surgical History:  Past Surgical History:  Procedure Laterality Date  . PROSTATE SURGERY       Home Meds: Prior to Admission medications   Medication Sig Start Date End Date Taking? Authorizing Provider  acetaminophen (TYLENOL) 500 MG tablet Take 500 mg by mouth every 6 (six) hours as needed.   Yes [provider]  amLODipine (NORVASC) 5 MG tablet Take 1 tablet (5 mg total) by mouth 2 (two) times daily as needed. Patient taking differently: Take 5 mg by mouth at bedtime.  09/12/16 09/12/17 Yes Minna Merritts, MD  aspirin EC 81 MG tablet Take 81 mg by mouth daily.   Yes [provider]  atenolol (TENORMIN) 25 MG tablet Take 25 mg by mouth 2 (two) times daily.    Yes [provider]  cetirizine (ZYRTEC) 10 MG tablet Take 10 mg by mouth daily.   Yes [provider]  cloNIDine (CATAPRES) 0.2 MG tablet Take 0.2 mg by mouth 2 (two) times daily.   Yes [provider]  glucose blood test strip 1 each by Other route as needed for other. Use as instructed   Yes [provider]  losartan (COZAAR) 50 MG tablet Take 50 mg by mouth daily.  09/12/16  Yes Minna Merritts, MD  lovastatin (MEVACOR) 40 MG tablet Take 40 mg by mouth at bedtime.   Yes [provider]  meclizine (ANTIVERT) 25 MG tablet TAKE 1 TABLET (25 MG TOTAL) BY MOUTH 3 (THREE) TIMES DAILY AS NEEDED. 05/13/17  Yes Gollan, Kathlene November, MD  metFORMIN (GLUCOPHAGE-XR) 750 MG 24 hr tablet Take 750 mg by mouth daily with breakfast.   Yes [provider]  omeprazole (PRILOSEC) 20 MG capsule Take 20 mg by mouth daily.   Yes [provider]    Allergies:  Allergies  Allergen Reactions  . Lisinopril     Social History   Socioeconomic History  . Marital status: Married  Spouse name: Not on file  . Number of children: Not on file  . Years of education: Not on file  . Highest education level: Not on file  Social Needs  . Financial resource strain: Not on file  . Food insecurity - worry: Not on file  . Food insecurity - inability: Not on file  . Transportation needs - medical: Not on file  . Transportation needs - non-medical: Not on file  Occupational History  . Not on file  Tobacco Use  . Smoking status: Never Smoker  . Smokeless tobacco: Never Used  Substance and Sexual Activity  . Alcohol use: No  . Drug use: Not on file  . Sexual activity: Not on file  Other Topics Concern  . Not on file  Social History Narrative  . Not on file     Family History  Problem Relation Age of Onset  . Colon cancer Mother   . Heart attack Father 81     ROS:   Please see the history of present illness.     All other systems reviewed and negative.    Physical Exam: Blood pressure 130/70, pulse (!) 48, height 5\' 6"  (1.676 m), weight 163 lb (73.9 kg). General: Well developed, well nourished male in no acute distress. Head: Normocephalic, atraumatic, sclera non-icteric, no xanthomas, nares are without discharge. EENT: normal  Lymph Nodes:  none Neck: Negative for carotid bruits. JVD not elevated. Back:without scoliosis kyphosis Lungs: Clear bilaterally to auscultation without wheezes, rales, or rhonchi. Breathing is unlabored. Heart: RRR with S1 S2. 2/6 murmur . No rubs, or gallops appreciated. Abdomen: Soft, non-tender, non-distended with normoactive bowel sounds. No hepatomegaly. No rebound/guarding. No obvious abdominal masses. Msk:  Strength and tone appear normal for age. Extremities: No clubbing or cyanosis. No edema.  Distal pedal pulses are 2+ and equal bilaterally. Skin: Warm and Dry Neuro: Alert and oriented X 3. CN III-XII intact Grossly normal sensory and motor function . Psych:  Responds to questions appropriately with a normal affect.      Labs: Cardiac Enzymes No results for input(s): CKTOTAL, CKMB, TROPONINI in the last 72 hours. CBC Lab Results  Component Value Date   WBC 5.3 08/12/2016   HGB 13.9 08/12/2016   HCT 39.2 (L) 08/12/2016   MCV 91.0 08/12/2016   PLT 168 08/12/2016   PROTIME: No results for input(s): LABPROT, INR in the last 72 hours. Chemistry No results for input(s): NA, K, CL, CO2, BUN, CREATININE, CALCIUM, PROT, BILITOT, ALKPHOS, ALT, AST, GLUCOSE in the last 168 hours.  Invalid input(s): LABALBU Lipids No results found for: CHOL, HDL, LDLCALC, TRIG BNP No results found for: PROBNP Thyroid Function Tests: No results for input(s): TSH, T4TOTAL, T3FREE, THYROIDAB in the last 72 hours.  Invalid input(s): FREET3 Miscellaneous No results found for: DDIMER  Radiology/Studies:  No results  found.  EKG: Sinus rhythm at 48 Intervals 34/07/44   Assessment and Plan:  Sinus bradycardia  First-degree AV block  Hypertension  Patient has long-standing sinus bradycardia the significance of which is not clear either related to symptoms or to the extent of chronotropic incompetence.  May be aggravated by his atenolol.  I think the first order of business is to stop it.  We will have him cut it in half daily for 1 week and then stop it altogether.  I think it is still appropriate to pursue treadmill testing both looking for chronotropic competence as well as the impact of exercise on his first-degree AV block.  Blood pressures been labile in the past.  Alternative therapies will be necessary.  It is noteworthy that he had angioedema with ACE inhibitors--it was this that prompted his [facility] ER visit   Virl Axe

## 2017-08-22 DIAGNOSIS — Z79899 Other long term (current) drug therapy: Secondary | ICD-10-CM | POA: Diagnosis not present

## 2017-08-22 DIAGNOSIS — I10 Essential (primary) hypertension: Secondary | ICD-10-CM | POA: Diagnosis not present

## 2017-08-22 DIAGNOSIS — Z0000 Encounter for general adult medical examination without abnormal findings: Secondary | ICD-10-CM | POA: Diagnosis not present

## 2017-08-22 DIAGNOSIS — Z125 Encounter for screening for malignant neoplasm of prostate: Secondary | ICD-10-CM | POA: Diagnosis not present

## 2017-08-22 DIAGNOSIS — E7800 Pure hypercholesterolemia, unspecified: Secondary | ICD-10-CM | POA: Diagnosis not present

## 2017-08-22 DIAGNOSIS — E119 Type 2 diabetes mellitus without complications: Secondary | ICD-10-CM | POA: Diagnosis not present

## 2017-08-25 ENCOUNTER — Telehealth: Payer: Self-pay | Admitting: Internal Medicine

## 2017-08-25 NOTE — Telephone Encounter (Signed)
Left detailed VM message with GXT instructions on pt's voice mail

## 2017-08-26 ENCOUNTER — Ambulatory Visit (INDEPENDENT_AMBULATORY_CARE_PROVIDER_SITE_OTHER): Payer: Medicare HMO

## 2017-08-26 DIAGNOSIS — R001 Bradycardia, unspecified: Secondary | ICD-10-CM

## 2017-08-28 LAB — EXERCISE TOLERANCE TEST
CHL CUP RESTING HR STRESS: 56 {beats}/min
CSEPHR: 72 %
CSEPPHR: 101 {beats}/min
Estimated workload: 8 METS
Exercise duration (min): 4 min
Exercise duration (sec): 24 s
MPHR: 139 {beats}/min

## 2017-11-19 ENCOUNTER — Encounter: Payer: Self-pay | Admitting: Family Medicine

## 2017-12-08 IMAGING — US US EXTREM LOW VENOUS*L*
1 series · 13 of 24 positions shown · non-contrast
Comparison: None.

CLINICAL DATA: Left lower extremity pain and edema for the past 4
days. History of prostate cancer. Evaluate for DVT.



[Series 1: us extrem low venous*left* · 0.07mm/px · 13 of 35 slices shown]
[im 1/35]
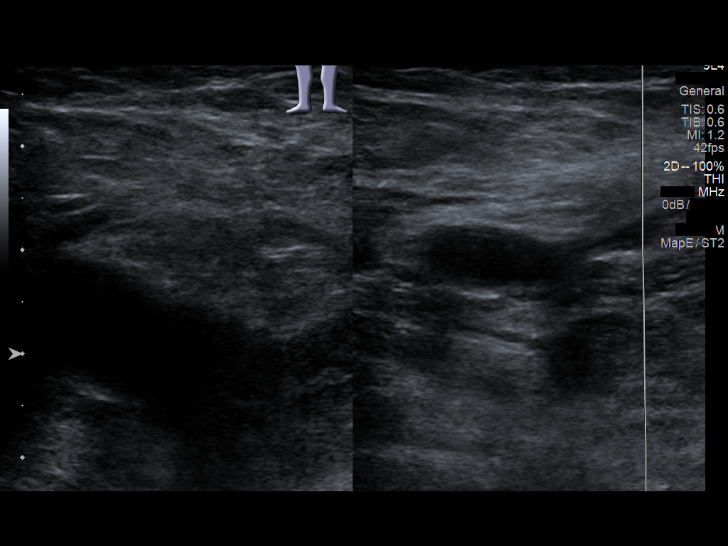
[im 3/35]
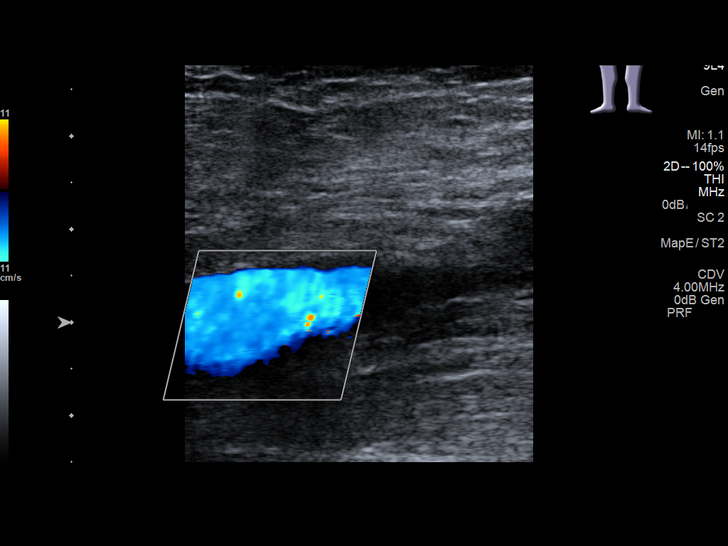
[im 6/35]
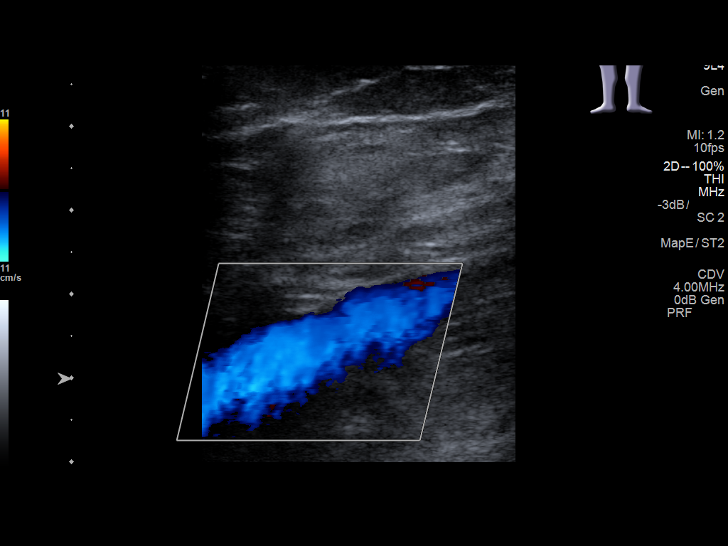
[im 9/35]
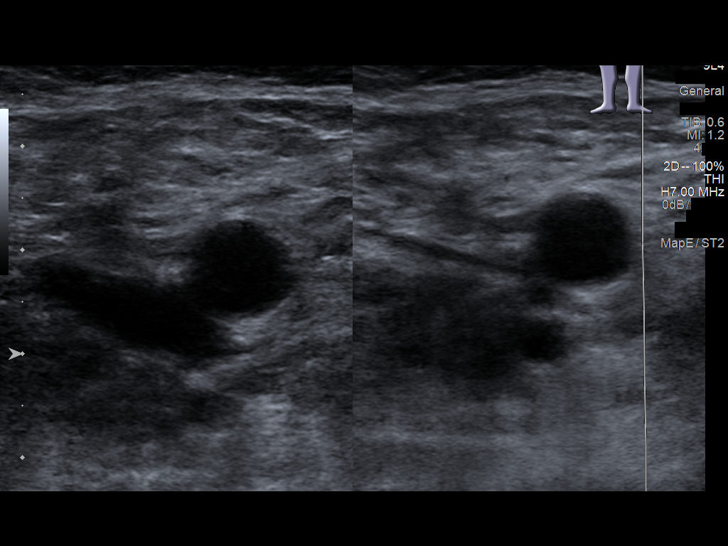
[im 12/35]
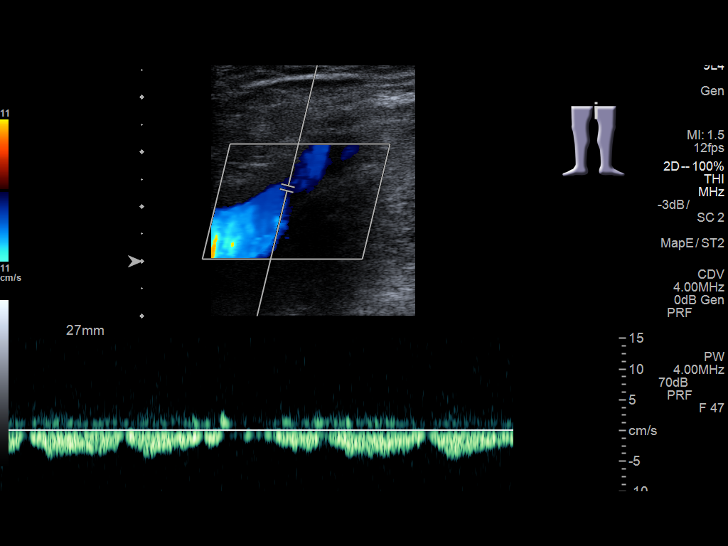
[im 15/35]
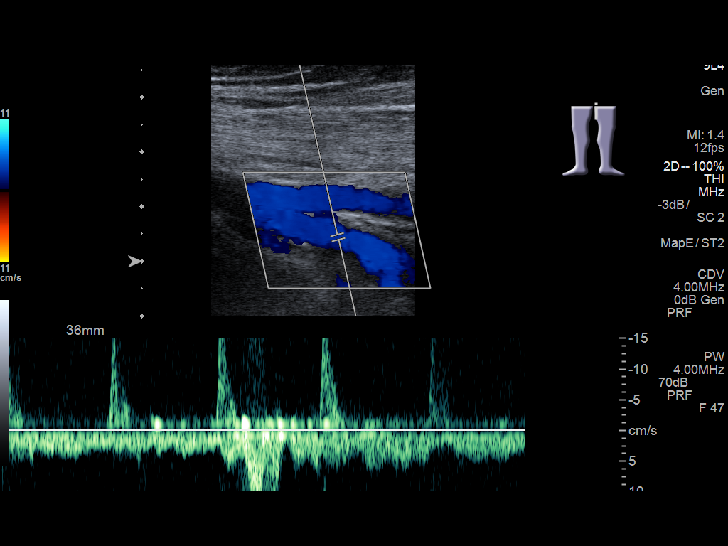
[im 18/35]
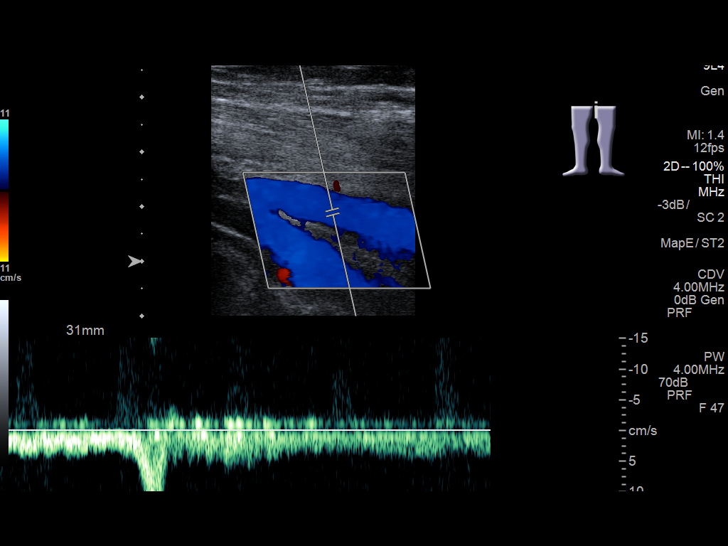
[im 20/35]
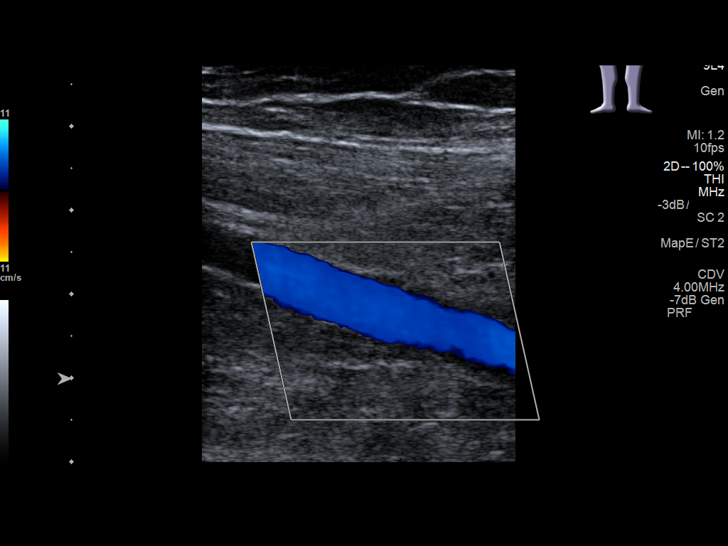
[im 23/35]
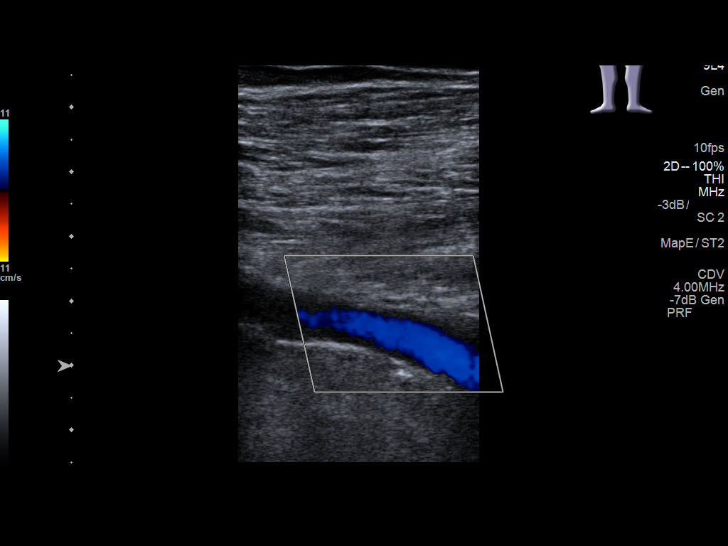
[im 26/35]
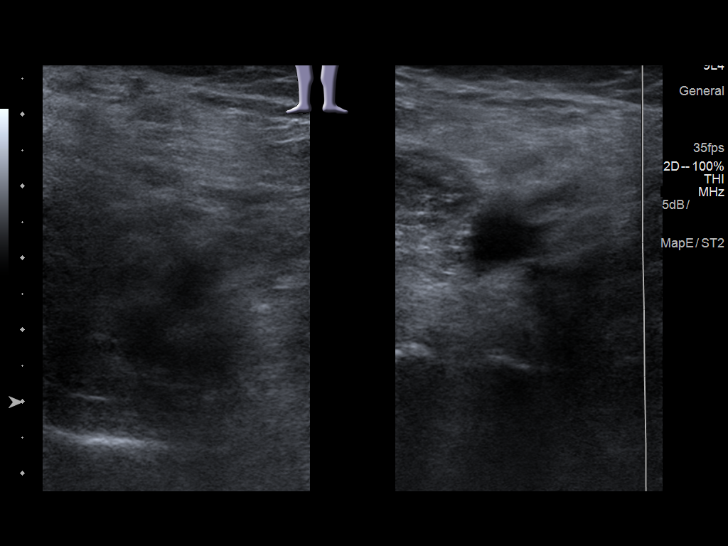
[im 29/35]
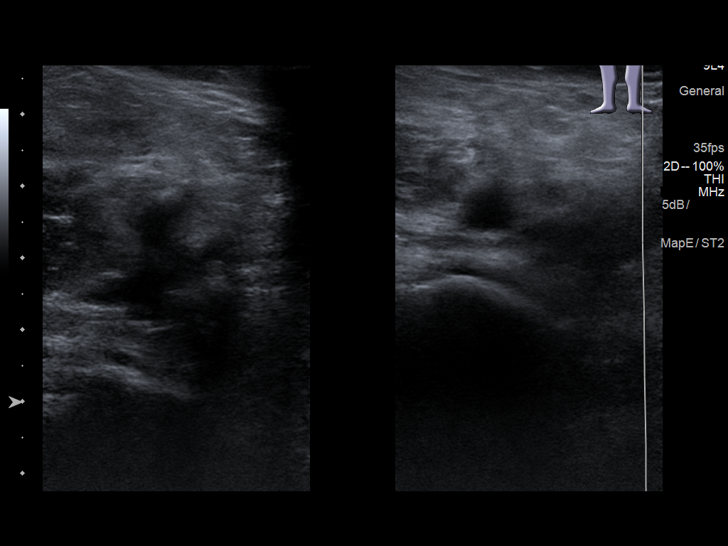
[im 32/35]
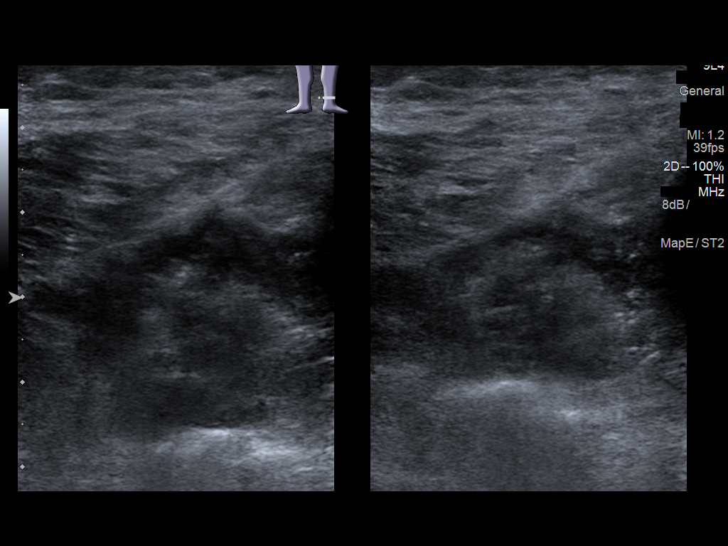
[im 35/35]
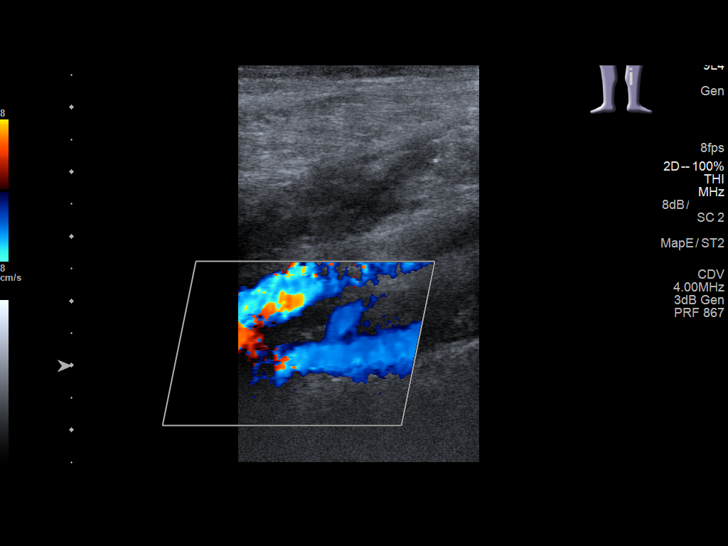

[13 of 24 positions shown; findings below may reference images not displayed]

FINDINGS: Contralateral Common Femoral Vein: Respiratory phasicity is normal
and symmetric with the symptomatic side. No evidence of thrombus.
Normal compressibility.

Common Femoral Vein: No evidence of thrombus. Normal
compressibility, respiratory phasicity and response to augmentation.

Saphenofemoral Junction: No evidence of thrombus. Normal
compressibility and flow on color Doppler imaging.

Profunda Femoral Vein: No evidence of thrombus. Normal
compressibility and flow on color Doppler imaging.

Femoral Vein: No evidence of thrombus. Normal compressibility,
respiratory phasicity and response to augmentation.

Popliteal Vein: No evidence of thrombus. Normal compressibility,
respiratory phasicity and response to augmentation.

Calf Veins: No evidence of thrombus. Normal compressibility and flow
on color Doppler imaging.

Superficial Great Saphenous Vein: No evidence of thrombus. Normal
compressibility and flow on color Doppler imaging.

Venous Reflux:  None.

Other Findings:  None.
IMPRESSION: No evidence of DVT within the left lower extremity.

## 2017-12-10 DIAGNOSIS — E119 Type 2 diabetes mellitus without complications: Secondary | ICD-10-CM | POA: Diagnosis not present

## 2017-12-10 DIAGNOSIS — Z1211 Encounter for screening for malignant neoplasm of colon: Secondary | ICD-10-CM | POA: Diagnosis not present

## 2017-12-10 DIAGNOSIS — Z125 Encounter for screening for malignant neoplasm of prostate: Secondary | ICD-10-CM | POA: Diagnosis not present

## 2017-12-10 DIAGNOSIS — E7800 Pure hypercholesterolemia, unspecified: Secondary | ICD-10-CM | POA: Diagnosis not present

## 2017-12-10 DIAGNOSIS — Z0000 Encounter for general adult medical examination without abnormal findings: Secondary | ICD-10-CM | POA: Diagnosis not present

## 2017-12-10 DIAGNOSIS — I10 Essential (primary) hypertension: Secondary | ICD-10-CM | POA: Diagnosis not present

## 2017-12-10 DIAGNOSIS — Z79899 Other long term (current) drug therapy: Secondary | ICD-10-CM | POA: Diagnosis not present

## 2017-12-18 DIAGNOSIS — Z1211 Encounter for screening for malignant neoplasm of colon: Secondary | ICD-10-CM | POA: Diagnosis not present

## 2018-01-29 DIAGNOSIS — E119 Type 2 diabetes mellitus without complications: Secondary | ICD-10-CM | POA: Diagnosis not present

## 2018-01-30 DIAGNOSIS — Z0100 Encounter for examination of eyes and vision without abnormal findings: Secondary | ICD-10-CM | POA: Diagnosis not present

## 2018-01-30 DIAGNOSIS — H524 Presbyopia: Secondary | ICD-10-CM | POA: Diagnosis not present

## 2018-03-12 DIAGNOSIS — Z79899 Other long term (current) drug therapy: Secondary | ICD-10-CM | POA: Diagnosis not present

## 2018-03-12 DIAGNOSIS — E119 Type 2 diabetes mellitus without complications: Secondary | ICD-10-CM | POA: Diagnosis not present

## 2018-03-12 DIAGNOSIS — E7800 Pure hypercholesterolemia, unspecified: Secondary | ICD-10-CM | POA: Diagnosis not present

## 2018-03-12 DIAGNOSIS — I10 Essential (primary) hypertension: Secondary | ICD-10-CM | POA: Diagnosis not present

## 2018-03-12 DIAGNOSIS — E785 Hyperlipidemia, unspecified: Secondary | ICD-10-CM | POA: Diagnosis not present

## 2018-06-11 DIAGNOSIS — I10 Essential (primary) hypertension: Secondary | ICD-10-CM | POA: Diagnosis not present

## 2018-06-11 DIAGNOSIS — E119 Type 2 diabetes mellitus without complications: Secondary | ICD-10-CM | POA: Diagnosis not present

## 2018-06-11 DIAGNOSIS — Z79899 Other long term (current) drug therapy: Secondary | ICD-10-CM | POA: Diagnosis not present

## 2018-06-11 DIAGNOSIS — K219 Gastro-esophageal reflux disease without esophagitis: Secondary | ICD-10-CM | POA: Diagnosis not present

## 2018-06-11 DIAGNOSIS — E7800 Pure hypercholesterolemia, unspecified: Secondary | ICD-10-CM | POA: Diagnosis not present

## 2018-08-15 ENCOUNTER — Observation Stay
Admission: EM | Admit: 2018-08-15 | Discharge: 2018-08-16 | Disposition: A | Discharge status: Home / Self Care | Payer: Medicare HMO | Attending: Internal Medicine | Admitting: Internal Medicine

## 2018-08-15 ENCOUNTER — Encounter: Payer: Self-pay | Admitting: *Deleted

## 2018-08-15 ENCOUNTER — Emergency Department: Payer: Medicare HMO

## 2018-08-15 ENCOUNTER — Other Ambulatory Visit: Payer: Self-pay

## 2018-08-15 DIAGNOSIS — R531 Weakness: Secondary | ICD-10-CM

## 2018-08-15 DIAGNOSIS — E119 Type 2 diabetes mellitus without complications: Secondary | ICD-10-CM | POA: Insufficient documentation

## 2018-08-15 DIAGNOSIS — E782 Mixed hyperlipidemia: Secondary | ICD-10-CM | POA: Diagnosis not present

## 2018-08-15 DIAGNOSIS — R7989 Other specified abnormal findings of blood chemistry: Secondary | ICD-10-CM

## 2018-08-15 DIAGNOSIS — Z8546 Personal history of malignant neoplasm of prostate: Secondary | ICD-10-CM | POA: Insufficient documentation

## 2018-08-15 DIAGNOSIS — E860 Dehydration: Secondary | ICD-10-CM | POA: Diagnosis not present

## 2018-08-15 DIAGNOSIS — J101 Influenza due to other identified influenza virus with other respiratory manifestations: Secondary | ICD-10-CM | POA: Diagnosis not present

## 2018-08-15 DIAGNOSIS — R748 Abnormal levels of other serum enzymes: Secondary | ICD-10-CM | POA: Insufficient documentation

## 2018-08-15 DIAGNOSIS — Z79899 Other long term (current) drug therapy: Secondary | ICD-10-CM | POA: Insufficient documentation

## 2018-08-15 DIAGNOSIS — R059 Cough, unspecified: Secondary | ICD-10-CM

## 2018-08-15 DIAGNOSIS — Z7984 Long term (current) use of oral hypoglycemic drugs: Secondary | ICD-10-CM | POA: Diagnosis not present

## 2018-08-15 DIAGNOSIS — K219 Gastro-esophageal reflux disease without esophagitis: Secondary | ICD-10-CM | POA: Insufficient documentation

## 2018-08-15 DIAGNOSIS — R05 Cough: Secondary | ICD-10-CM

## 2018-08-15 DIAGNOSIS — I10 Essential (primary) hypertension: Secondary | ICD-10-CM | POA: Insufficient documentation

## 2018-08-15 DIAGNOSIS — Z7982 Long term (current) use of aspirin: Secondary | ICD-10-CM | POA: Diagnosis not present

## 2018-08-15 DIAGNOSIS — R778 Other specified abnormalities of plasma proteins: Secondary | ICD-10-CM

## 2018-08-15 LAB — CBC WITH DIFFERENTIAL/PLATELET
Abs Immature Granulocytes: 0.03 10*3/uL (ref 0.00–0.07)
Basophils Absolute: 0 10*3/uL (ref 0.0–0.1)
Basophils Relative: 0 %
Eosinophils Absolute: 0 10*3/uL (ref 0.0–0.5)
Eosinophils Relative: 0 %
HCT: 40.3 % (ref 39.0–52.0)
Hemoglobin: 13.9 g/dL (ref 13.0–17.0)
Immature Granulocytes: 0 %
LYMPHS ABS: 0.9 10*3/uL (ref 0.7–4.0)
Lymphocytes Relative: 12 %
MCH: 31.7 pg (ref 26.0–34.0)
MCHC: 34.5 g/dL (ref 30.0–36.0)
MCV: 91.8 fL (ref 80.0–100.0)
MONOS PCT: 14 %
Monocytes Absolute: 1 10*3/uL (ref 0.1–1.0)
Neutro Abs: 5.5 10*3/uL (ref 1.7–7.7)
Neutrophils Relative %: 74 %
Platelets: 168 10*3/uL (ref 150–400)
RBC: 4.39 MIL/uL (ref 4.22–5.81)
RDW: 12.2 % (ref 11.5–15.5)
WBC: 7.5 10*3/uL (ref 4.0–10.5)
nRBC: 0 % (ref 0.0–0.2)

## 2018-08-15 LAB — GLUCOSE, CAPILLARY
Glucose-Capillary: 137 mg/dL — ABNORMAL HIGH (ref 70–99)
Glucose-Capillary: 114 mg/dL — ABNORMAL HIGH (ref 70–99)
Glucose-Capillary: 169 mg/dL — ABNORMAL HIGH (ref 70–99)
Glucose-Capillary: 138 mg/dL — ABNORMAL HIGH (ref 70–99)

## 2018-08-15 LAB — TSH: TSH: 0.421 u[IU]/mL (ref 0.350–4.500)

## 2018-08-15 LAB — COMPREHENSIVE METABOLIC PANEL
ALT: 17 U/L (ref 0–44)
AST: 24 U/L (ref 15–41)
Albumin: 4.2 g/dL (ref 3.5–5.0)
Alkaline Phosphatase: 64 U/L (ref 38–126)
Anion gap: 6 (ref 5–15)
BUN: 15 mg/dL (ref 8–23)
CO2: 26 mmol/L (ref 22–32)
CREATININE: 1.29 mg/dL — AB (ref 0.61–1.24)
Calcium: 9.4 mg/dL (ref 8.9–10.3)
Chloride: 102 mmol/L (ref 98–111)
GFR calc Af Amer: 60 mL/min — ABNORMAL LOW (ref >60)
GFR calc non Af Amer: 52 mL/min — ABNORMAL LOW (ref >60)
Glucose, Bld: 159 mg/dL — ABNORMAL HIGH (ref 70–99)
Potassium: 4 mmol/L (ref 3.5–5.1)
Sodium: 134 mmol/L — ABNORMAL LOW (ref 135–145)
Total Bilirubin: 0.9 mg/dL (ref 0.3–1.2)
Total Protein: 8 g/dL (ref 6.5–8.1)

## 2018-08-15 LAB — TROPONIN I
Troponin I: 0.03 ng/mL (ref <0.03)
Troponin I: <0.03 ng/mL (ref <0.03)
Troponin I: 0.03 ng/mL (ref <0.03)
Troponin I: 0.03 ng/mL (ref <0.03)

## 2018-08-15 LAB — INFLUENZA PANEL BY PCR (TYPE A & B)
Influenza A By PCR: NEGATIVE
Influenza B By PCR: POSITIVE — AB

## 2018-08-15 LAB — HEMOGLOBIN A1C
Hgb A1c MFr Bld: 7.2 % — ABNORMAL HIGH (ref 4.8–5.6)
Mean Plasma Glucose: 159.94 mg/dL

## 2018-08-15 MED ORDER — ACETAMINOPHEN 325 MG PO TABS
650.0000 mg | ORAL_TABLET | Freq: Four times a day (QID) | ORAL | Status: DC | PRN
  Administered 2018-08-15 (×2): 650 mg via ORAL
  Filled 2018-08-15 (×2): qty 2

## 2018-08-15 MED ORDER — HYDROCOD POLST-CPM POLST ER 10-8 MG/5ML PO SUER
5.0000 mL | Freq: Two times a day (BID) | ORAL | Status: DC
  Administered 2018-08-15 – 2018-08-16 (×3): 5 mL via ORAL
  Filled 2018-08-15 (×3): qty 5

## 2018-08-15 MED ORDER — INSULIN ASPART 100 UNIT/ML ~~LOC~~ SOLN
0.0000 [IU] | Freq: Three times a day (TID) | SUBCUTANEOUS | Status: DC
  Administered 2018-08-15: 1 [IU] via SUBCUTANEOUS
  Administered 2018-08-15 – 2018-08-16 (×2): 2 [IU] via SUBCUTANEOUS
  Administered 2018-08-16: 1 [IU] via SUBCUTANEOUS
  Filled 2018-08-15 (×4): qty 1

## 2018-08-15 MED ORDER — PANTOPRAZOLE SODIUM 40 MG PO TBEC
40.0000 mg | DELAYED_RELEASE_TABLET | Freq: Every day | ORAL | Status: DC
  Administered 2018-08-15 – 2018-08-16 (×2): 40 mg via ORAL
  Filled 2018-08-15 (×2): qty 1

## 2018-08-15 MED ORDER — ENOXAPARIN SODIUM 40 MG/0.4ML ~~LOC~~ SOLN
40.0000 mg | SUBCUTANEOUS | Status: DC
  Administered 2018-08-15: 22:00:00 40 mg via SUBCUTANEOUS
  Filled 2018-08-15: qty 0.4

## 2018-08-15 MED ORDER — ASPIRIN 81 MG PO CHEW
324.0000 mg | CHEWABLE_TABLET | Freq: Once | ORAL | Status: AC
  Administered 2018-08-15: 324 mg via ORAL
  Filled 2018-08-15: qty 4

## 2018-08-15 MED ORDER — ALBUTEROL SULFATE (2.5 MG/3ML) 0.083% IN NEBU
2.5000 mg | INHALATION_SOLUTION | Freq: Once | RESPIRATORY_TRACT | Status: AC
  Administered 2018-08-15: 2.5 mg via RESPIRATORY_TRACT
  Filled 2018-08-15: qty 3

## 2018-08-15 MED ORDER — AMLODIPINE BESYLATE 5 MG PO TABS
5.0000 mg | ORAL_TABLET | Freq: Every day | ORAL | Status: DC
  Administered 2018-08-15: 5 mg via ORAL
  Filled 2018-08-15: qty 1

## 2018-08-15 MED ORDER — SODIUM CHLORIDE 0.9 % IV SOLN
INTRAVENOUS | Status: DC
  Administered 2018-08-15 – 2018-08-16 (×4): via INTRAVENOUS

## 2018-08-15 MED ORDER — ATENOLOL 25 MG PO TABS
25.0000 mg | ORAL_TABLET | Freq: Two times a day (BID) | ORAL | Status: DC
  Administered 2018-08-15 – 2018-08-16 (×2): 25 mg via ORAL
  Filled 2018-08-15 (×3): qty 1

## 2018-08-15 MED ORDER — HYDROCOD POLST-CPM POLST ER 10-8 MG/5ML PO SUER
5.0000 mL | Freq: Once | ORAL | Status: AC
  Administered 2018-08-15: 5 mL via ORAL
  Filled 2018-08-15: qty 5

## 2018-08-15 MED ORDER — GLIMEPIRIDE 4 MG PO TABS
4.0000 mg | ORAL_TABLET | Freq: Every day | ORAL | Status: DC
  Administered 2018-08-16: 10:00:00 4 mg via ORAL
  Filled 2018-08-15: qty 1

## 2018-08-15 MED ORDER — OSELTAMIVIR PHOSPHATE 75 MG PO CAPS: 75.0000 mg | ORAL_CAPSULE | Freq: Two times a day (BID) | ORAL | Status: DC

## 2018-08-15 MED ORDER — ONDANSETRON HCL 4 MG PO TABS: 4.0000 mg | ORAL_TABLET | Freq: Four times a day (QID) | ORAL | Status: DC | PRN

## 2018-08-15 MED ORDER — PRAVASTATIN SODIUM 20 MG PO TABS
40.0000 mg | ORAL_TABLET | Freq: Every day | ORAL | Status: DC
  Administered 2018-08-15: 18:00:00 40 mg via ORAL
  Filled 2018-08-15: qty 2

## 2018-08-15 MED ORDER — OSELTAMIVIR PHOSPHATE 30 MG PO CAPS
30.0000 mg | ORAL_CAPSULE | Freq: Two times a day (BID) | ORAL | Status: DC
  Administered 2018-08-15 – 2018-08-16 (×3): 30 mg via ORAL
  Filled 2018-08-15 (×3): qty 1

## 2018-08-15 MED ORDER — INSULIN ASPART 100 UNIT/ML ~~LOC~~ SOLN: 0.0000 [IU] | Freq: Every day | SUBCUTANEOUS | Status: DC

## 2018-08-15 MED ORDER — ONDANSETRON HCL 4 MG/2ML IJ SOLN: 4.0000 mg | Freq: Four times a day (QID) | INTRAMUSCULAR | Status: DC | PRN

## 2018-08-15 MED ORDER — ACETAMINOPHEN 650 MG RE SUPP: 650.0000 mg | Freq: Four times a day (QID) | RECTAL | Status: DC | PRN

## 2018-08-15 MED ORDER — SODIUM CHLORIDE 0.9 % IV BOLUS
500.0000 mL | Freq: Once | INTRAVENOUS | Status: AC
  Administered 2018-08-15: 500 mL via INTRAVENOUS

## 2018-08-15 MED ORDER — METFORMIN HCL ER 750 MG PO TB24
750.0000 mg | ORAL_TABLET | Freq: Every day | ORAL | Status: DC
  Administered 2018-08-16: 10:00:00 750 mg via ORAL
  Filled 2018-08-15: qty 1

## 2018-08-15 MED ORDER — DOCUSATE SODIUM 100 MG PO CAPS
100.0000 mg | ORAL_CAPSULE | Freq: Two times a day (BID) | ORAL | Status: DC
  Administered 2018-08-15 – 2018-08-16 (×3): 100 mg via ORAL
  Filled 2018-08-15 (×3): qty 1

## 2018-08-15 NOTE — Progress Notes (Signed)
[facility] NAME: Henry Frazier    MR#:  [id number]  DATE OF BIRTH:  Dec 10, 1934  SUBJECTIVE:  CHIEF COMPLAINT:   Chief Complaint  Patient presents with  . Cough   -  Doing well  - fevers last night, flu positive - complaints of cough  REVIEW OF SYSTEMS:  Review of Systems  Constitutional: Positive for fever and malaise/fatigue. Negative for chills.  HENT: Negative for congestion, ear discharge, hearing loss and nosebleeds.   Respiratory: Positive for cough and shortness of breath. Negative for wheezing.   Cardiovascular: Negative for chest pain and palpitations.  Gastrointestinal: Negative for abdominal pain, constipation, diarrhea, nausea and vomiting.  Genitourinary: Negative for dysuria.  Musculoskeletal: Positive for myalgias.  Neurological: Negative for dizziness, focal weakness, seizures, weakness and headaches.  Psychiatric/Behavioral: Negative for depression.    DRUG ALLERGIES:   Allergies  Allergen Reactions  . Lisinopril Swelling    VITALS:  Blood pressure 132/68, pulse 60, temperature 98.6 F (37 C), temperature source Oral, resp. rate 20, height 5\' 6"  (1.676 m), weight 72.6 kg, SpO2 95 %.  PHYSICAL EXAMINATION:  Physical Exam  GENERAL:  84 y.o.-year-old patient lying in the bed with no acute distress.  EYES: Pupils equal, round, reactive to light and accommodation. No scleral icterus. Extraocular muscles intact.  HEENT: Head atraumatic, normocephalic. Oropharynx and nasopharynx clear.  NECK:  Supple, no jugular venous distention. No thyroid enlargement, no tenderness.  LUNGS: Normal breath sounds bilaterally, right basilar rhonchi.  no wheezing, rales  or crepitation. No use of accessory muscles of respiration.  CARDIOVASCULAR: S1, S2 normal. No murmurs, rubs, or gallops.  ABDOMEN: Soft, nontender, nondistended. Bowel sounds present. No organomegaly or mass.  EXTREMITIES: No pedal edema, cyanosis, or  clubbing.  NEUROLOGIC: Cranial nerves II through XII are intact. Muscle strength 5/5 in all extremities. Sensation intact. Gait not checked.  PSYCHIATRIC: The patient is alert and oriented x 3.  SKIN: No obvious rash, lesion, or ulcer.    LABORATORY PANEL:   CBC Recent Labs  Lab 08/15/18 0318  WBC 7.5  HGB 13.9  HCT 40.3  PLT 168   ------------------------------------------------------------------------------------------------------------------  Chemistries  Recent Labs  Lab 08/15/18 0318  NA 134*  K 4.0  CL 102  CO2 26  GLUCOSE 159*  BUN 15  CREATININE 1.29*  CALCIUM 9.4  AST 24  ALT 17  ALKPHOS 64  BILITOT 0.9   ------------------------------------------------------------------------------------------------------------------  Cardiac Enzymes Recent Labs  Lab 08/15/18 1231  TROPONINI 0.03*   ------------------------------------------------------------------------------------------------------------------  RADIOLOGY:  Dg Chest 2 View  Result Date: 08/15/2018 CLINICAL DATA:  Cough EXAM: CHEST - 2 VIEW COMPARISON:  Report 12/10/2017 FINDINGS: The heart size and mediastinal contours are within normal limits. Both lungs are clear. The visualized skeletal structures are unremarkable. Minimal scarring at the lingula IMPRESSION: No active cardiopulmonary disease. Electronically Signed   By: Donavan Foil M.D.   On: 08/15/2018 03:36    EKG:   Orders placed or performed during the hospital encounter of 08/15/18  . EKG 12-Lead  . EKG 12-Lead  . ED EKG  . ED EKG    ASSESSMENT AND PLAN:   84 year old male with past medical history significant for hypertension, diabetes, history of prostate cancer, arthritis presents to hospital secondary to worsening cough and shortness of breath.  1.  Influenza illness-lab indicates influenza B positive -Last x-ray is otherwise clear -Continue Tamiflu, supportive treatment -Incentive spirometry, cough meds  2.   Hypertension-continue atenolol  and Norvasc -Hold losartan for now  3.  Diabetes mellitus-on metformin and glimepiride  4.  DVT prophylaxis-Lovenox   All the records are reviewed and case discussed with Care Management/Social Workerr. Management plans discussed with the patient, family and they are in agreement.  CODE STATUS: Full Code  TOTAL TIME TAKING CARE OF THIS PATIENT: 38 minutes.   POSSIBLE D/C IN 1-2 DAYS, DEPENDING ON CLINICAL CONDITION.   Gladstone Lighter M.D on 08/15/2018 at 3:22 PM  Between 7am to 6pm - Pager - [phone]  After 6pm go to [email or web address] - password [email or web address] [facility]  Office  [phone]  CC: Primary care physician; Idelle Crouch, MD

## 2018-08-15 NOTE — ED Provider Notes (Signed)
[facility] Emergency Department Provider Note   ____________________________________________   First MD Initiated Contact with Patient 08/15/18 0421     (approximate)  I have reviewed the triage vital signs and the nursing notes.   HISTORY  Chief Complaint Cough    HPI GURNIE DURIS is a 84 y.o. male who presents to the ED from home with a chief complaint of productive cough, fever, dizziness, emesis, generalized weakness.  Symptoms x1 day.  Denies associated chest pain, shortness of breath, abdominal pain, diarrhea.  Denies recent travel or trauma.    Past Medical History:  Diagnosis Date  . Acid reflux   . Arthritis   . Diabetes mellitus without complication ([place])   . Heart murmur   . Hypertension   . Prostate cancer ([place])   . Pure hypercholesterolemia     Patient Active Problem List   Diagnosis Date Noted  . Essential hypertension 09/12/2016  . Mixed hyperlipidemia 09/12/2016  . Gastroesophageal reflux disease without esophagitis 09/12/2016  . Type 2 diabetes mellitus without complication, without long-term current use of insulin ([place]) 09/12/2016  . Prostate cancer ([place]) 09/12/2016    Past Surgical History:  Procedure Laterality Date  . PROSTATE SURGERY      Prior to Admission medications   Medication Sig Start Date End Date Taking? Authorizing Provider  acetaminophen (TYLENOL) 500 MG tablet Take 500 mg by mouth every 6 (six) hours as needed.    [provider]  amLODipine (NORVASC) 5 MG tablet Take 1 tablet (5 mg total) by mouth 2 (two) times daily as needed. Patient taking differently: Take 5 mg by mouth at bedtime.  09/12/16 09/12/17  Minna Merritts, MD  aspirin EC 81 MG tablet Take 81 mg by mouth daily.    [provider]  atenolol (TENORMIN) 25 MG tablet Take 25 mg by mouth 2 (two) times daily.     [provider]  cetirizine (ZYRTEC) 10 MG tablet Take 10 mg by mouth daily.    [provider]  cloNIDine (CATAPRES) 0.2 MG tablet Take 0.2 mg by mouth 2 (two) times daily.    [provider]  glucose blood test strip 1 each by Other route as needed for other. Use as instructed    [provider]  losartan (COZAAR) 50 MG tablet Take 50 mg by mouth daily.  09/12/16   Minna Merritts, MD  lovastatin (MEVACOR) 40 MG tablet Take 40 mg by mouth at bedtime.    [provider]  meclizine (ANTIVERT) 25 MG tablet TAKE 1 TABLET (25 MG TOTAL) BY MOUTH 3 (THREE) TIMES DAILY AS NEEDED. 05/13/17   Minna Merritts, MD  metFORMIN (GLUCOPHAGE-XR) 750 MG 24 hr tablet Take 750 mg by mouth daily with breakfast.    [provider]  omeprazole (PRILOSEC) 20 MG capsule Take 20 mg by mouth daily.    [provider]    Allergies Lisinopril  Family History  Problem Relation Age of Onset  . Colon cancer Mother   . Heart attack Father 31    Social History Social History   Tobacco Use  . Smoking status: Never Smoker  . Smokeless tobacco: Never Used  Substance Use Topics  . Alcohol use: No  . Drug use: Not on file    Review of Systems  Constitutional: Positive for fever/chills Eyes: No visual changes. ENT: No sore throat. Cardiovascular: Denies chest pain. Respiratory: Positive for productive cough.  Denies shortness of breath. Gastrointestinal: No  abdominal pain.  Positive for vomiting.  No diarrhea.  No constipation. Genitourinary: Negative for dysuria. Musculoskeletal: Negative for back pain. Skin: Negative for rash. Neurological: Negative for headaches, focal weakness or numbness.   ____________________________________________   PHYSICAL EXAM:  VITAL SIGNS: ED Triage Vitals  Enc Vitals Group     BP 08/15/18 0306 (!) 157/68     Pulse Rate 08/15/18 0306 69     Resp 08/15/18 0306 18     Temp 08/15/18 0306 100 F (37.8 C)     Temp Source 08/15/18 0306 Oral     SpO2 08/15/18 0306 97 %     Weight 08/15/18 0306 160 lb (72.6 kg)      Height 08/15/18 0306 5\' 6"  (1.676 m)     Head Circumference --      Peak Flow --      Pain Score 08/15/18 0305 0     Pain Loc --      Pain Edu? --      Excl. in [place]? --     Constitutional: Alert and oriented. Well appearing and in mild acute distress. Eyes: Conjunctivae are normal. PERRL. EOMI. Head: Atraumatic. Nose: Congestion/rhinnorhea. Mouth/Throat: Mucous membranes are moist.  Oropharynx non-erythematous. Neck: No stridor.  Supple neck without meningismus. Cardiovascular: Normal rate, regular rhythm. Grossly normal heart sounds.  Good peripheral circulation. Respiratory: Normal respiratory effort.  No retractions. Lungs CTAB. Gastrointestinal: Soft and nontender. No distention. No abdominal bruits. No CVA tenderness. Musculoskeletal: No lower extremity tenderness nor edema.  No joint effusions. Neurologic:  Normal speech and language. No gross focal neurologic deficits are appreciated.  Skin:  Skin is warm, dry and intact. No rash noted.  No petechiae. Psychiatric: Mood and affect are normal. Speech and behavior are normal.  ____________________________________________   LABS (all labs ordered are listed, but only abnormal results are displayed)  Labs Reviewed  INFLUENZA PANEL BY PCR (TYPE A & B) - Abnormal; Notable for the following components:      Result Value   Influenza B By PCR POSITIVE (*)    All other components within normal limits  COMPREHENSIVE METABOLIC PANEL - Abnormal; Notable for the following components:   Sodium 134 (*)    Glucose, Bld 159 (*)    Creatinine, Ser 1.29 (*)    GFR calc non Af Amer 52 (*)    GFR calc Af Amer 60 (*)    All other components within normal limits  TROPONIN I - Abnormal; Notable for the following components:   Troponin I 0.03 (*)    All other components within normal limits  CBC WITH DIFFERENTIAL/PLATELET   ____________________________________________  EKG  ED ECG REPORT I, Morgin Halls J, the attending physician,  personally viewed and interpreted this ECG.   Date: 08/15/2018  EKG Time: 0311  Rate: 65  Rhythm: normal EKG, normal sinus rhythm  Axis: Normal  Intervals:none  ST&T Change: Nonspecific  ____________________________________________  RADIOLOGY  ED MD interpretation: No acute cardiopulmonary process  Official radiology report(s): Dg Chest 2 View  Result Date: 08/15/2018 CLINICAL DATA:  Cough EXAM: CHEST - 2 VIEW COMPARISON:  Report 12/10/2017 FINDINGS: The heart size and mediastinal contours are within normal limits. Both lungs are clear. The visualized skeletal structures are unremarkable. Minimal scarring at the lingula IMPRESSION: No active cardiopulmonary disease. Electronically Signed   By: Donavan Foil M.D.   On: 08/15/2018 03:36    ____________________________________________   PROCEDURES  Procedure(s) performed: None  Procedures  Critical Care performed: Yes, see critical care  note(s)  ____________________________________________   INITIAL IMPRESSION / ASSESSMENT AND PLAN / ED COURSE  As part of my medical decision making, I reviewed the following data within the [place] History obtained from family, Nursing notes reviewed and incorporated, Labs reviewed, EKG interpreted, Old chart reviewed, Radiograph reviewed, Discussed with admitting physician and Notes from prior ED visits    84 year old male who presents with flulike symptoms, productive cough and emesis.  Found to have influenza B as well as elevated troponin which is most likely myocardial stress.  Will initiate IV fluid resuscitation, Tamiflu, aspirin therapy.   Clinical Course as of Aug 15 428  Sat Aug 15, 2018  0429 Discussed with hospitalist Dr. Marcille Blanco who will evaluate patient in the emergency department for admission.   [JS]    Clinical Course User Index [JS] Paulette Blanch, MD     ____________________________________________   FINAL CLINICAL IMPRESSION(S) / ED  DIAGNOSES  Final diagnoses:  Influenza B  Elevated troponin  Cough  Weakness     ED Discharge Orders    None       Note:  This document was prepared using Dragon voice recognition software and may include unintentional dictation errors.   Paulette Blanch, MD 08/15/18 [phone]

## 2018-08-15 NOTE — Care Management Obs Status (Signed)
[facility] [place] NOTIFICATION   Patient Details  Name: Henry Frazier MRN: [id number] Date of Birth: 10/28/1934   Medicare Observation Status Notification Given:  Yes    Cantrell Martus A Jaclin Finks, RN 08/15/2018, 10:24 AM

## 2018-08-15 NOTE — H&P (Signed)
Henry Frazier is an 84 y.o. male.   Chief Complaint: Weakness HPI: The patient with past medical history of diabetes, hypertension and prostate cancer presents to the emergency department complaining of weakness.  The patient is also had cough, chills, vomiting and diarrhea.  The latter were non-bloody.  Laboratory evaluation revealed influenza A as well as some acute kidney injury.  He received a dose of Tamiflu as well as intravenous fluid.  Troponin was found to be mildly elevated as well although the patient denied chest pain.  He received 4 aspirin prior to the emergency department staff calling the hospitalist service for admission.  Past Medical History:  Diagnosis Date  . Acid reflux   . Arthritis   . Diabetes mellitus without complication ([place])   . Heart murmur   . Hypertension   . Prostate cancer ([place])   . Pure hypercholesterolemia     Past Surgical History:  Procedure Laterality Date  . PROSTATE SURGERY      Family History  Problem Relation Age of Onset  . Colon cancer Mother   . Heart attack Father 56   Social History:  reports that he has never smoked. He has never used smokeless tobacco. He reports that he does not drink alcohol. No history on file for drug.  Allergies:  Allergies  Allergen Reactions  . Lisinopril Swelling    Medications Prior to Admission  Medication Sig Dispense Refill  . acetaminophen (TYLENOL) 500 MG tablet Take 500 mg by mouth every 6 (six) hours as needed.    Marland Kitchen amLODipine (NORVASC) 5 MG tablet Take 1 tablet (5 mg total) by mouth 2 (two) times daily as needed. (Patient taking differently: Take 5 mg by mouth at bedtime. ) 60 tablet 11  . atenolol (TENORMIN) 25 MG tablet Take 25 mg by mouth 2 (two) times daily.     . cetirizine (ZYRTEC) 10 MG tablet Take 10 mg by mouth daily.    . cloNIDine (CATAPRES) 0.2 MG tablet Take 0.2 mg by mouth 2 (two) times daily.    . fluticasone (FLONASE) 50 MCG/ACT nasal spray Place 2 sprays into the nose daily.     Marland Kitchen glimepiride (AMARYL) 4 MG tablet Take 4 mg by mouth daily with breakfast.    . glucose blood test strip 1 each by Other route as needed for other. Use as instructed    . losartan (COZAAR) 50 MG tablet Take 50 mg by mouth daily.     Marland Kitchen lovastatin (MEVACOR) 40 MG tablet Take 40 mg by mouth at bedtime.    . meclizine (ANTIVERT) 25 MG tablet TAKE 1 TABLET (25 MG TOTAL) BY MOUTH 3 (THREE) TIMES DAILY AS NEEDED. 60 tablet 3  . metFORMIN (GLUCOPHAGE-XR) 750 MG 24 hr tablet Take 750 mg by mouth daily with breakfast.    . omeprazole (PRILOSEC) 20 MG capsule Take 20 mg by mouth daily.      Results for orders placed or performed during the hospital encounter of 08/15/18 (from the past 48 hour(s))  Influenza panel by PCR (type A & B)     Status: Abnormal   Collection Time: 08/15/18  3:18 AM  Result Value Ref Range   Influenza A By PCR NEGATIVE NEGATIVE   Influenza B By PCR POSITIVE (A) NEGATIVE    Comment: (NOTE) The Xpert Xpress Flu assay is intended as an aid in the diagnosis of  influenza and should not be used as a sole basis for treatment.  This  assay is FDA  approved for nasopharyngeal swab specimens only. Nasal  washings and aspirates are unacceptable for Xpert Xpress Flu testing. Performed at [facility], [address] [place], [place] [address]   CBC with Differential     Status: None   Collection Time: 08/15/18  3:18 AM  Result Value Ref Range   WBC 7.5 4.0 - 10.5 K/uL   RBC 4.39 4.22 - 5.81 MIL/uL   Hemoglobin 13.9 13.0 - 17.0 g/dL   HCT 40.3 39.0 - 52.0 %   MCV 91.8 80.0 - 100.0 fL   MCH 31.7 26.0 - 34.0 pg   MCHC 34.5 30.0 - 36.0 g/dL   RDW 12.2 11.5 - 15.5 %   Platelets 168 150 - 400 K/uL   nRBC 0.0 0.0 - 0.2 %   Neutrophils Relative % 74 %   Neutro Abs 5.5 1.7 - 7.7 K/uL   Lymphocytes Relative 12 %   Lymphs Abs 0.9 0.7 - 4.0 K/uL   Monocytes Relative 14 %   Monocytes Absolute 1.0 0.1 - 1.0 K/uL   Eosinophils Relative 0 %   Eosinophils Absolute 0.0 0.0 -  0.5 K/uL   Basophils Relative 0 %   Basophils Absolute 0.0 0.0 - 0.1 K/uL   Immature Granulocytes 0 %   Abs Immature Granulocytes 0.03 0.00 - 0.07 K/uL    Comment: Performed at [facility], [address] [place], [place] [address]  Comprehensive metabolic panel     Status: Abnormal   Collection Time: 08/15/18  3:18 AM  Result Value Ref Range   Sodium 134 (L) 135 - 145 mmol/L   Potassium 4.0 3.5 - 5.1 mmol/L   Chloride 102 98 - 111 mmol/L   CO2 26 22 - 32 mmol/L   Glucose, Bld 159 (H) 70 - 99 mg/dL   BUN 15 8 - 23 mg/dL   Creatinine, Ser 1.29 (H) 0.61 - 1.24 mg/dL   Calcium 9.4 8.9 - 10.3 mg/dL   Total Protein 8.0 6.5 - 8.1 g/dL   Albumin 4.2 3.5 - 5.0 g/dL   AST 24 15 - 41 U/L   ALT 17 0 - 44 U/L   Alkaline Phosphatase 64 38 - 126 U/L   Total Bilirubin 0.9 0.3 - 1.2 mg/dL   GFR calc non Af Amer 52 (L) >60 mL/min   GFR calc Af Amer 60 (L) >60 mL/min   Anion gap 6 5 - 15    Comment: Performed at [facility], [address] [place], [place] [address]  Troponin I - ONCE - STAT     Status: Abnormal   Collection Time: 08/15/18  3:18 AM  Result Value Ref Range   Troponin I 0.03 (HH) <0.03 ng/mL    Comment: CRITICAL RESULT CALLED TO, READ BACK BY AND VERIFIED WITH ANN CALES 08/15/18 @ 1093  [place] Performed at [facility], [address] [place], [place] [address]    Dg Chest 2 View  Result Date: 08/15/2018 CLINICAL DATA:  Cough EXAM: CHEST - 2 VIEW COMPARISON:  Report 12/10/2017 FINDINGS: The heart size and mediastinal contours are within normal limits. Both lungs are clear. The visualized skeletal structures are unremarkable. Minimal scarring at the lingula IMPRESSION: No active cardiopulmonary disease. Electronically Signed   By: Donavan Foil M.D.   On: 08/15/2018 03:36    Review of Systems  Constitutional: Positive for malaise/fatigue. Negative for chills and fever.  HENT: Negative for sore throat and tinnitus.   Eyes: Negative for blurred  vision and  redness.  Respiratory: Positive for cough. Negative for shortness of breath.   Cardiovascular: Negative for chest pain, palpitations, orthopnea and PND.  Gastrointestinal: Positive for diarrhea and vomiting. Negative for abdominal pain and nausea.  Genitourinary: Negative for dysuria, frequency and urgency.  Musculoskeletal: Negative for joint pain and myalgias.  Skin: Negative for rash.       No lesions  Neurological: Negative for speech change and focal weakness.  Endo/Heme/Allergies: Does not bruise/bleed easily.       No temperature intolerance  Psychiatric/Behavioral: Negative for depression and suicidal ideas.    Blood pressure (!) 158/87, pulse 93, temperature (!) 102.1 F (38.9 C), resp. rate 15, height 5\' 6"  (1.676 m), weight 72.6 kg, SpO2 99 %. Physical Exam  Vitals reviewed. Constitutional: He is oriented to person, place, and time. He appears well-developed and well-nourished. No distress.  HENT:  Head: Normocephalic and atraumatic.  Mouth/Throat: Oropharynx is clear and moist.  Eyes: Pupils are equal, round, and reactive to light. Conjunctivae and EOM are normal. No scleral icterus.  Neck: Normal range of motion. Neck supple. No JVD present. No tracheal deviation present. No thyromegaly present.  Cardiovascular: Normal rate, regular rhythm and normal heart sounds. Exam reveals no gallop and no friction rub.  No murmur heard. Respiratory: Effort normal and breath sounds normal. No respiratory distress.  GI: Soft. Bowel sounds are normal. He exhibits no distension. There is no abdominal tenderness.  Genitourinary:    Genitourinary Comments: Deferred   Musculoskeletal: Normal range of motion.        General: No edema.  Lymphadenopathy:    He has no cervical adenopathy.  Neurological: He is alert and oriented to person, place, and time. No cranial nerve deficit.  Skin: Skin is warm and dry. No rash noted. No erythema.  Psychiatric: He has a normal mood and  affect. His behavior is normal. Judgment and thought content normal.     Assessment/Plan This is a 84 year old male admitted for flu. 1.  Influenza: Type A; continue Tamiflu.  No evidence of pneumonia or encephalitis.  The patient inconsistently meets criteria for sepsis.  Given positive for influenza virus systemic inflammatory response consistent with viral sepsis. 2.  Dehydration: Mild elevation in creatinine as well as concentrated urine.  Encourage p.o. intake.  Hydrate with intravenous fluid. 3.  Hypertension: Uncontrolled; continue amlodipine, losartan and atenolol 4.  Diabetes mellitus type 2: Sliding scale insulin while hospitalized.  Hold metformin. 5.  DVT prophylaxis: Lovenox 6.  GI prophylaxis: None The patient is a full code.  Time spent on admission orders and patient care approximately 45 minutes  Harrie Foreman, MD 08/15/2018, 5:51 AM

## 2018-08-15 NOTE — ED Triage Notes (Signed)
Pt states has had a cough since yesterday. Pt states he now is having brown to bloody sputum production. Pt with mask in triage. Pt states he believes he has had a fever at home. Pt states he has been dizzy and has had emesis tonight as well.

## 2018-08-15 NOTE — ED Notes (Signed)
ED TO INPATIENT HANDOFF REPORT  Name/Age/Gender Henry Frazier 84 y.o. male  Code Status   Home/SNF/Other   Chief Complaint spitting up blood chest congestion  Level of Care/Admitting Diagnosis ED Disposition    ED Disposition Condition [place]: [place] [[id number]]  Level of Care: Med-Surg [16]  Diagnosis: Influenza A [277824]  Admitting Physician: Harrie Foreman [[id number]]  Attending Physician: Harrie Foreman [phone]  PT Class (Do Not Modify): Observation [104]  PT Acc Code (Do Not Modify): Observation [10022]       Medical History Past Medical History:  Diagnosis Date  . Acid reflux   . Arthritis   . Diabetes mellitus without complication ([place])   . Heart murmur   . Hypertension   . Prostate cancer ([place])   . Pure hypercholesterolemia     Allergies Allergies  Allergen Reactions  . Lisinopril Swelling    IV Location/Drains/Wounds Patient Lines/Drains/Airways Status   Active Line/Drains/Airways    Name:   Placement date:   Placement time:   Site:   Days:   Peripheral IV 08/15/18 Forearm   08/15/18    0451    Forearm   less than 1          Labs/Imaging Results for orders placed or performed during the hospital encounter of 08/15/18 (from the past 48 hour(s))  Influenza panel by PCR (type A & B)     Status: Abnormal   Collection Time: 08/15/18  3:18 AM  Result Value Ref Range   Influenza A By PCR NEGATIVE NEGATIVE   Influenza B By PCR POSITIVE (A) NEGATIVE    Comment: (NOTE) The Xpert Xpress Flu assay is intended as an aid in the diagnosis of  influenza and should not be used as a sole basis for treatment.  This  assay is FDA approved for nasopharyngeal swab specimens only. Nasal  washings and aspirates are unacceptable for Xpert Xpress Flu testing. Performed at [facility], [address] [place], [place] [address]   CBC with Differential     Status: None   Collection Time:  08/15/18  3:18 AM  Result Value Ref Range   WBC 7.5 4.0 - 10.5 K/uL   RBC 4.39 4.22 - 5.81 MIL/uL   Hemoglobin 13.9 13.0 - 17.0 g/dL   HCT 40.3 39.0 - 52.0 %   MCV 91.8 80.0 - 100.0 fL   MCH 31.7 26.0 - 34.0 pg   MCHC 34.5 30.0 - 36.0 g/dL   RDW 12.2 11.5 - 15.5 %   Platelets 168 150 - 400 K/uL   nRBC 0.0 0.0 - 0.2 %   Neutrophils Relative % 74 %   Neutro Abs 5.5 1.7 - 7.7 K/uL   Lymphocytes Relative 12 %   Lymphs Abs 0.9 0.7 - 4.0 K/uL   Monocytes Relative 14 %   Monocytes Absolute 1.0 0.1 - 1.0 K/uL   Eosinophils Relative 0 %   Eosinophils Absolute 0.0 0.0 - 0.5 K/uL   Basophils Relative 0 %   Basophils Absolute 0.0 0.0 - 0.1 K/uL   Immature Granulocytes 0 %   Abs Immature Granulocytes 0.03 0.00 - 0.07 K/uL    Comment: Performed at [facility], [address] [place], [place] [address]  Comprehensive metabolic panel     Status: Abnormal   Collection Time: 08/15/18  3:18 AM  Result Value Ref Range   Sodium 134 (L) 135 - 145 mmol/L   Potassium 4.0 3.5 -  5.1 mmol/L   Chloride 102 98 - 111 mmol/L   CO2 26 22 - 32 mmol/L   Glucose, Bld 159 (H) 70 - 99 mg/dL   BUN 15 8 - 23 mg/dL   Creatinine, Ser 1.29 (H) 0.61 - 1.24 mg/dL   Calcium 9.4 8.9 - 10.3 mg/dL   Total Protein 8.0 6.5 - 8.1 g/dL   Albumin 4.2 3.5 - 5.0 g/dL   AST 24 15 - 41 U/L   ALT 17 0 - 44 U/L   Alkaline Phosphatase 64 38 - 126 U/L   Total Bilirubin 0.9 0.3 - 1.2 mg/dL   GFR calc non Af Amer 52 (L) >60 mL/min   GFR calc Af Amer 60 (L) >60 mL/min   Anion gap 6 5 - 15    Comment: Performed at [facility], [address] [place], [place] [address]  Troponin I - ONCE - STAT     Status: Abnormal   Collection Time: 08/15/18  3:18 AM  Result Value Ref Range   Troponin I 0.03 (HH) <0.03 ng/mL    Comment: CRITICAL RESULT CALLED TO, READ BACK BY AND VERIFIED WITH ANN CALES 08/15/18 @ 1324  [place] Performed at [facility], [address] [place], [place] [address]    Dg Chest 2  View  Result Date: 08/15/2018 CLINICAL DATA:  Cough EXAM: CHEST - 2 VIEW COMPARISON:  Report 12/10/2017 FINDINGS: The heart size and mediastinal contours are within normal limits. Both lungs are clear. The visualized skeletal structures are unremarkable. Minimal scarring at the lingula IMPRESSION: No active cardiopulmonary disease. Electronically Signed   By: Donavan Foil M.D.   On: 08/15/2018 03:36    Pending Labs [employer] (From admission, onward)    Start     Ordered   Signed and Held  Creatinine, serum  (enoxaparin (LOVENOX)    CrCl >/= 30 ml/min)  Weekly,   R    Comments:  while on enoxaparin therapy    Signed and Held   Signed and Held  TSH  Add-on,   R     Signed and Held   Signed and Held  Troponin I - Now Then Q6H  Now then every 6 hours,   R     Signed and Held   Signed and Held  Hemoglobin A1c  Add-on,   R     Signed and Held          Vitals/Pain Today's Vitals   08/15/18 0305 08/15/18 0306  BP:  (!) 157/68  Pulse:  69  Resp:  18  Temp:  100 F (37.8 C)  TempSrc:  Oral  SpO2:  97%  Weight:  72.6 kg  Height:  5\' 6"  (1.676 m)  PainSc: 0-No pain     Isolation Precautions Droplet precaution  Medications Medications  sodium chloride 0.9 % bolus 500 mL (500 mLs Intravenous New Bag/Given 08/15/18 0453)  oseltamivir (TAMIFLU) capsule 75 mg (has no administration in time range)  albuterol (PROVENTIL) (2.5 MG/3ML) 0.083% nebulizer solution 2.5 mg (2.5 mg Nebulization Given 08/15/18 0452)  chlorpheniramine-HYDROcodone (TUSSIONEX) 10-8 MG/5ML suspension 5 mL (5 mLs Oral Given 08/15/18 0435)  aspirin chewable tablet 324 mg (324 mg Oral Given 08/15/18 0452)

## 2018-08-15 NOTE — Progress Notes (Signed)
Denies co's; afebrile today. Eating well. Up to BR after setup. Denies co's. IVF's continued. Encouraged to ambulate more.

## 2018-08-16 LAB — BASIC METABOLIC PANEL
Anion gap: 6 (ref 5–15)
BUN: 15 mg/dL (ref 8–23)
CO2: 23 mmol/L (ref 22–32)
CREATININE: 1.05 mg/dL (ref 0.61–1.24)
Calcium: 8.5 mg/dL — ABNORMAL LOW (ref 8.9–10.3)
Chloride: 107 mmol/L (ref 98–111)
GFR calc non Af Amer: >60 mL/min (ref >60)
Glucose, Bld: 121 mg/dL — ABNORMAL HIGH (ref 70–99)
Potassium: 3.7 mmol/L (ref 3.5–5.1)
Sodium: 136 mmol/L (ref 135–145)

## 2018-08-16 LAB — GLUCOSE, CAPILLARY
Glucose-Capillary: 133 mg/dL — ABNORMAL HIGH (ref 70–99)
Glucose-Capillary: 152 mg/dL — ABNORMAL HIGH (ref 70–99)

## 2018-08-16 MED ORDER — GUAIFENESIN-DM 100-10 MG/5ML PO SYRP
5.0000 mL | ORAL_SOLUTION | ORAL | Status: DC | PRN
  Administered 2018-08-16: 5 mL via ORAL
  Filled 2018-08-16: qty 5

## 2018-08-16 MED ORDER — OSELTAMIVIR PHOSPHATE 30 MG PO CAPS: 30.0000 mg | ORAL_CAPSULE | Freq: Two times a day (BID) | ORAL | 0 refills | Status: AC

## 2018-08-16 MED ORDER — HYDROCOD POLST-CPM POLST ER 10-8 MG/5ML PO SUER: 5.0000 mL | Freq: Two times a day (BID) | ORAL | 0 refills | Status: AC

## 2018-08-16 NOTE — Progress Notes (Signed)
Oral and written AVS instructions given to pt and family member with stated understanding. States ready for discharge home to self/family care.

## 2018-08-16 NOTE — Progress Notes (Signed)
Pt desires to stay until around 5 P.M. Pt currently napping. Prescription given to dgt with oral and written AVS instructions- she is going to pick up prescriptions and return. Will give pt oral and written AVS instructions. Will discharge to home/self care when ready. Pt requested IVF's to continue until near ready for discharge home.

## 2018-08-16 NOTE — Progress Notes (Signed)
Transported in transport chair to private vehicle accompanied by family with discharge home to self/family care.

## 2018-08-17 NOTE — Discharge Summary (Signed)
[place] at [place] NAME: Henry Frazier    MR#:  [id number]  DATE OF BIRTH:  08-29-1934  DATE OF ADMISSION:  08/15/2018   ADMITTING PHYSICIAN: Harrie Foreman, MD  DATE OF DISCHARGE: 08/16/2018  6:44 PM  PRIMARY CARE PHYSICIAN: Idelle Crouch, MD   ADMISSION DIAGNOSIS:   Cough [R05] Weakness [R53.1] Influenza B [J10.1] Elevated troponin [R79.89]  DISCHARGE DIAGNOSIS:   Active Problems:   Influenza A   SECONDARY DIAGNOSIS:   Past Medical History:  Diagnosis Date  . Acid reflux   . Arthritis   . Diabetes mellitus without complication ([place])   . Heart murmur   . Hypertension   . Prostate cancer ([place])   . Pure hypercholesterolemia     HOSPITAL COURSE:   84 year old male with past medical history significant for hypertension, diabetes, history of prostate cancer, arthritis presents to hospital secondary to worsening cough and shortness of breath.  1.  Influenza illness-lab indicates influenza B positive -Chest x-ray is otherwise clear -Continue Tamiflu, supportive treatment --Clinical improvement noted -Incentive spirometry, cough meds  2.  Hypertension-continue atenolol, losartan and Norvasc  3.  Diabetes mellitus-on metformin and glimepiride  4.  Hyperlipidemia-continue statin  5.  GERD-PPI  Patient has been ambulating well in the hospital.  Breathing is much improved.  Will be discharged home today  DISCHARGE CONDITIONS:   Guarded  CONSULTS OBTAINED:   None  DRUG ALLERGIES:   Allergies  Allergen Reactions  . Lisinopril Swelling   DISCHARGE MEDICATIONS:   Allergies as of 08/16/2018      Reactions   Lisinopril Swelling      Medication List    STOP taking these medications   cloNIDine 0.2 MG tablet Commonly known as:  CATAPRES     TAKE these medications   acetaminophen 500 MG tablet Commonly known as:  TYLENOL Take 500 mg by mouth every 6 (six) hours as needed.   amLODipine 5 MG  tablet Commonly known as:  NORVASC Take 1 tablet (5 mg total) by mouth 2 (two) times daily as needed. What changed:  when to take this   atenolol 25 MG tablet Commonly known as:  TENORMIN Take 25 mg by mouth 2 (two) times daily.   cetirizine 10 MG tablet Commonly known as:  ZYRTEC Take 10 mg by mouth daily.   chlorpheniramine-HYDROcodone 10-8 MG/5ML [place] Commonly known as:  TUSSIONEX Take 5 mLs by mouth every 12 (twelve) hours for 5 days.   fluticasone 50 MCG/ACT nasal spray Commonly known as:  FLONASE Place 2 sprays into the nose daily.   glimepiride 4 MG tablet Commonly known as:  AMARYL Take 4 mg by mouth daily with breakfast.   glucose blood test strip 1 each by Other route as needed for other. Use as instructed   losartan 50 MG tablet Commonly known as:  COZAAR Take 50 mg by mouth daily.   lovastatin 40 MG tablet Commonly known as:  MEVACOR Take 40 mg by mouth at bedtime.   meclizine 25 MG tablet Commonly known as:  ANTIVERT TAKE 1 TABLET (25 MG TOTAL) BY MOUTH 3 (THREE) TIMES DAILY AS NEEDED.   metFORMIN 750 MG 24 hr tablet Commonly known as:  GLUCOPHAGE-XR Take 750 mg by mouth daily with breakfast.   omeprazole 20 MG capsule Commonly known as:  PRILOSEC Take 20 mg by mouth daily.   oseltamivir 30 MG capsule Commonly known as:  TAMIFLU Take 1 capsule (30 mg total) by  mouth 2 (two) times daily for 3 days.        DISCHARGE INSTRUCTIONS:   1.  PCP follow-up in 1 to 2 weeks  DIET:   Cardiac diet  ACTIVITY:   Activity as tolerated  OXYGEN:   Home Oxygen: No.  Oxygen Delivery: room air  DISCHARGE LOCATION:   home   If you experience worsening of your admission symptoms, develop shortness of breath, life threatening emergency, suicidal or homicidal thoughts you must seek medical attention immediately by calling 911 or calling your MD immediately  if symptoms less severe.  You Must read complete instructions/literature along with all the  possible adverse reactions/side effects for all the Medicines you take and that have been prescribed to you. Take any new Medicines after you have completely understood and accpet all the possible adverse reactions/side effects.   Please note  You were cared for by a hospitalist during your hospital stay. If you have any questions about your discharge medications or the care you received while you were in the hospital after you are discharged, you can call the unit and asked to speak with the hospitalist on call if the hospitalist that took care of you is not available. Once you are discharged, your primary care physician will handle any further medical issues. Please note that NO REFILLS for any discharge medications will be authorized once you are discharged, as it is imperative that you return to your primary care physician (or establish a relationship with a primary care physician if you do not have one) for your aftercare needs so that they can reassess your need for medications and monitor your lab values.    On the day of Discharge:  VITAL SIGNS:   Blood pressure 137/74, pulse 63, temperature 98.9 F (37.2 C), temperature source Oral, resp. rate 20, height 5\' 6"  (1.676 m), weight 72.6 kg, SpO2 92 %.  PHYSICAL EXAMINATION:   GENERAL:  84 y.o.-year-old patient lying in the bed with no acute distress.  EYES: Pupils equal, round, reactive to light and accommodation. No scleral icterus. Extraocular muscles intact.  HEENT: Head atraumatic, normocephalic. Oropharynx and nasopharynx clear.  NECK:  Supple, no jugular venous distention. No thyroid enlargement, no tenderness.  LUNGS: Normal breath sounds bilaterally,  improved right basilar rhonchi.  no wheezing, rales  or crepitation. No use of accessory muscles of respiration.  CARDIOVASCULAR: S1, S2 normal. No murmurs, rubs, or gallops.  ABDOMEN: Soft, nontender, nondistended. Bowel sounds present. No organomegaly or mass.  EXTREMITIES: No  pedal edema, cyanosis, or clubbing.  NEUROLOGIC: Cranial nerves II through XII are intact. Muscle strength 5/5 in all extremities. Sensation intact. Gait not checked.  PSYCHIATRIC: The patient is alert and oriented x 3.  SKIN: No obvious rash, lesion, or ulcer.  DATA REVIEW:   CBC Recent Labs  Lab 08/15/18 0318  WBC 7.5  HGB 13.9  HCT 40.3  PLT 168    Chemistries  Recent Labs  Lab 08/15/18 0318 08/16/18 0535  NA 134* 136  K 4.0 3.7  CL 102 107  CO2 26 23  GLUCOSE 159* 121*  BUN 15 15  CREATININE 1.29* 1.05  CALCIUM 9.4 8.5*  AST 24  --   ALT 17  --   ALKPHOS 64  --   BILITOT 0.9  --      Microbiology Results  No results found for this or any previous visit.  RADIOLOGY:  No results found.   Management plans discussed with the patient, family and they  are in agreement.  CODE STATUS:  Code Status History    Date Active Date Inactive Code Status Order ID Comments User Context   08/15/2018 0601 08/16/2018 2149 Full Code [id number]  Harrie Foreman, MD Inpatient      TOTAL TIME TAKING CARE OF THIS PATIENT: 38 minutes.    Gladstone Lighter M.D on 08/17/2018 at 3:38 PM  Between 7am to 6pm - Pager - [phone]  After 6pm go to [email or web address] - Proofreader  [facility]  Office  [phone]  CC: Primary care physician; Idelle Crouch, MD   Note: This dictation was prepared with Dragon dictation along with smaller phrase technology. Any transcriptional errors that result from this process are unintentional.

## 2018-12-21 DIAGNOSIS — Z20828 Contact with and (suspected) exposure to other viral communicable diseases: Secondary | ICD-10-CM | POA: Diagnosis not present

## 2018-12-21 DIAGNOSIS — E7800 Pure hypercholesterolemia, unspecified: Secondary | ICD-10-CM | POA: Diagnosis not present

## 2018-12-21 DIAGNOSIS — E118 Type 2 diabetes mellitus with unspecified complications: Secondary | ICD-10-CM | POA: Diagnosis not present

## 2018-12-21 DIAGNOSIS — Z79899 Other long term (current) drug therapy: Secondary | ICD-10-CM | POA: Diagnosis not present

## 2018-12-21 DIAGNOSIS — Z1211 Encounter for screening for malignant neoplasm of colon: Secondary | ICD-10-CM | POA: Diagnosis not present

## 2018-12-21 DIAGNOSIS — I10 Essential (primary) hypertension: Secondary | ICD-10-CM | POA: Diagnosis not present

## 2018-12-21 DIAGNOSIS — Z125 Encounter for screening for malignant neoplasm of prostate: Secondary | ICD-10-CM | POA: Diagnosis not present

## 2018-12-22 DIAGNOSIS — Z79899 Other long term (current) drug therapy: Secondary | ICD-10-CM | POA: Diagnosis not present

## 2018-12-22 DIAGNOSIS — I10 Essential (primary) hypertension: Secondary | ICD-10-CM | POA: Diagnosis not present

## 2018-12-22 DIAGNOSIS — E118 Type 2 diabetes mellitus with unspecified complications: Secondary | ICD-10-CM | POA: Diagnosis not present

## 2018-12-22 DIAGNOSIS — Z125 Encounter for screening for malignant neoplasm of prostate: Secondary | ICD-10-CM | POA: Diagnosis not present

## 2018-12-22 DIAGNOSIS — Z20828 Contact with and (suspected) exposure to other viral communicable diseases: Secondary | ICD-10-CM | POA: Diagnosis not present

## 2018-12-22 DIAGNOSIS — E7800 Pure hypercholesterolemia, unspecified: Secondary | ICD-10-CM | POA: Diagnosis not present

## 2018-12-30 DIAGNOSIS — Z1211 Encounter for screening for malignant neoplasm of colon: Secondary | ICD-10-CM | POA: Diagnosis not present

## 2019-02-18 DIAGNOSIS — H524 Presbyopia: Secondary | ICD-10-CM | POA: Diagnosis not present

## 2019-03-17 DIAGNOSIS — Z125 Encounter for screening for malignant neoplasm of prostate: Secondary | ICD-10-CM | POA: Diagnosis not present

## 2019-03-17 DIAGNOSIS — Z87891 Personal history of nicotine dependence: Secondary | ICD-10-CM | POA: Diagnosis not present

## 2019-03-17 DIAGNOSIS — Z79899 Other long term (current) drug therapy: Secondary | ICD-10-CM | POA: Diagnosis not present

## 2019-03-17 DIAGNOSIS — R600 Localized edema: Secondary | ICD-10-CM | POA: Diagnosis not present

## 2019-03-17 DIAGNOSIS — E7800 Pure hypercholesterolemia, unspecified: Secondary | ICD-10-CM | POA: Diagnosis not present

## 2019-03-17 DIAGNOSIS — Z0000 Encounter for general adult medical examination without abnormal findings: Secondary | ICD-10-CM | POA: Diagnosis not present

## 2019-03-17 DIAGNOSIS — I10 Essential (primary) hypertension: Secondary | ICD-10-CM | POA: Diagnosis not present

## 2019-03-17 DIAGNOSIS — E119 Type 2 diabetes mellitus without complications: Secondary | ICD-10-CM | POA: Diagnosis not present

## 2019-03-17 DIAGNOSIS — R002 Palpitations: Secondary | ICD-10-CM | POA: Diagnosis not present

## 2019-03-26 DIAGNOSIS — R002 Palpitations: Secondary | ICD-10-CM | POA: Diagnosis not present

## 2019-04-02 ENCOUNTER — Other Ambulatory Visit: Payer: Self-pay

## 2019-04-02 DIAGNOSIS — Z20822 Contact with and (suspected) exposure to covid-19: Secondary | ICD-10-CM

## 2019-04-02 DIAGNOSIS — Z20828 Contact with and (suspected) exposure to other viral communicable diseases: Secondary | ICD-10-CM | POA: Diagnosis not present

## 2019-04-03 LAB — NOVEL CORONAVIRUS, NAA: SARS-CoV-2, NAA: NOT DETECTED

## 2019-06-14 DIAGNOSIS — I10 Essential (primary) hypertension: Secondary | ICD-10-CM | POA: Diagnosis not present

## 2019-06-14 DIAGNOSIS — Z79899 Other long term (current) drug therapy: Secondary | ICD-10-CM | POA: Diagnosis not present

## 2019-06-14 DIAGNOSIS — E118 Type 2 diabetes mellitus with unspecified complications: Secondary | ICD-10-CM | POA: Diagnosis not present

## 2019-06-14 DIAGNOSIS — Z125 Encounter for screening for malignant neoplasm of prostate: Secondary | ICD-10-CM | POA: Diagnosis not present

## 2019-06-14 DIAGNOSIS — E7800 Pure hypercholesterolemia, unspecified: Secondary | ICD-10-CM | POA: Diagnosis not present

## 2019-06-21 DIAGNOSIS — E118 Type 2 diabetes mellitus with unspecified complications: Secondary | ICD-10-CM | POA: Diagnosis not present

## 2019-06-21 DIAGNOSIS — E7800 Pure hypercholesterolemia, unspecified: Secondary | ICD-10-CM | POA: Diagnosis not present

## 2019-06-21 DIAGNOSIS — Z79899 Other long term (current) drug therapy: Secondary | ICD-10-CM | POA: Diagnosis not present

## 2019-06-21 DIAGNOSIS — Z87891 Personal history of nicotine dependence: Secondary | ICD-10-CM | POA: Diagnosis not present

## 2019-06-21 DIAGNOSIS — I10 Essential (primary) hypertension: Secondary | ICD-10-CM | POA: Diagnosis not present

## 2019-06-21 DIAGNOSIS — R002 Palpitations: Secondary | ICD-10-CM | POA: Diagnosis not present

## 2019-08-09 IMAGING — CR DG CHEST 2V
1 series · 2 of 2 positions shown · non-contrast
Comparison: Report 12/10/2017

CLINICAL DATA: Cough

EXAM:
CHEST - 2 VIEW

[Series 1: dg chest 2 view · 0.14mm/px · 2 of 2 slices shown]
[im 1/2]
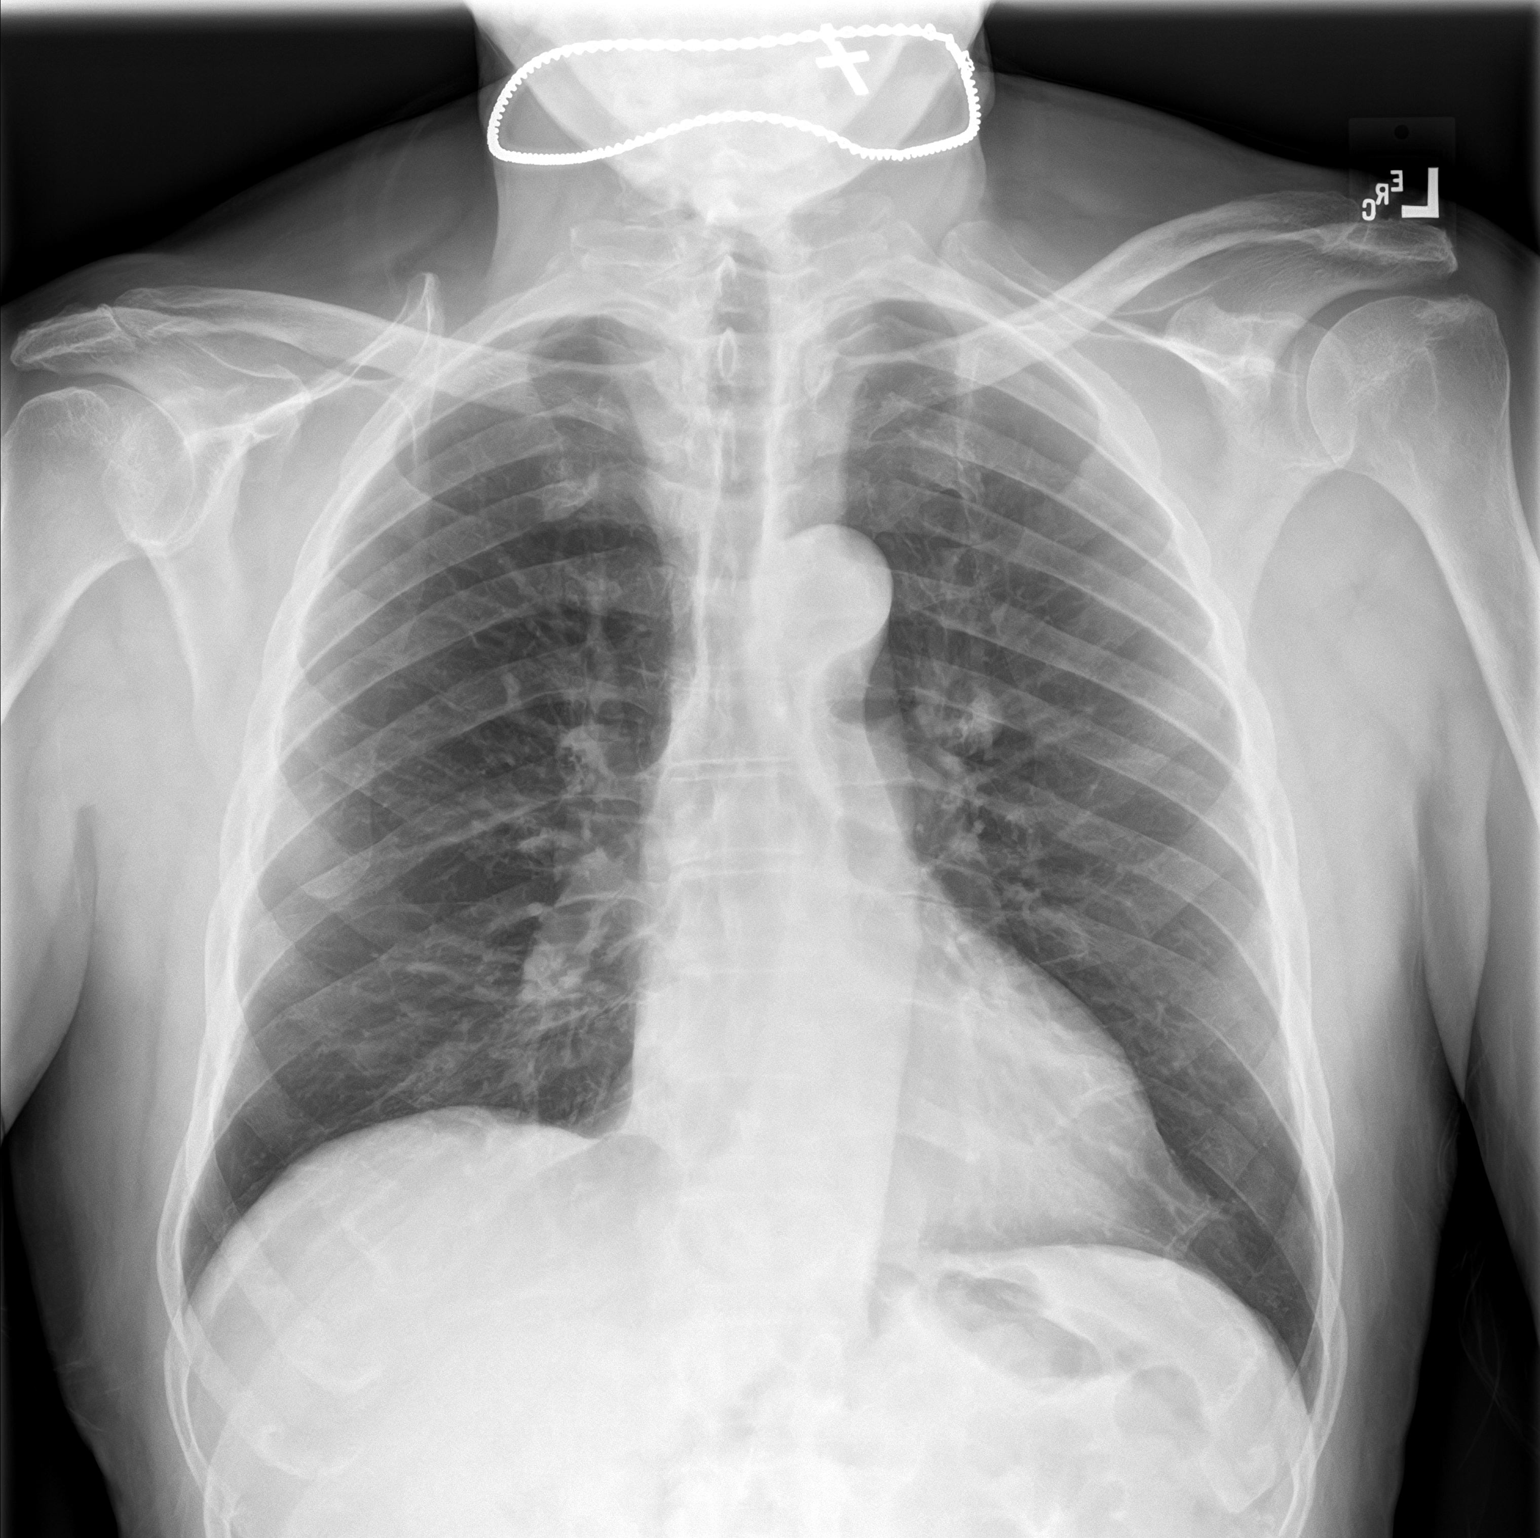
[im 2/2]
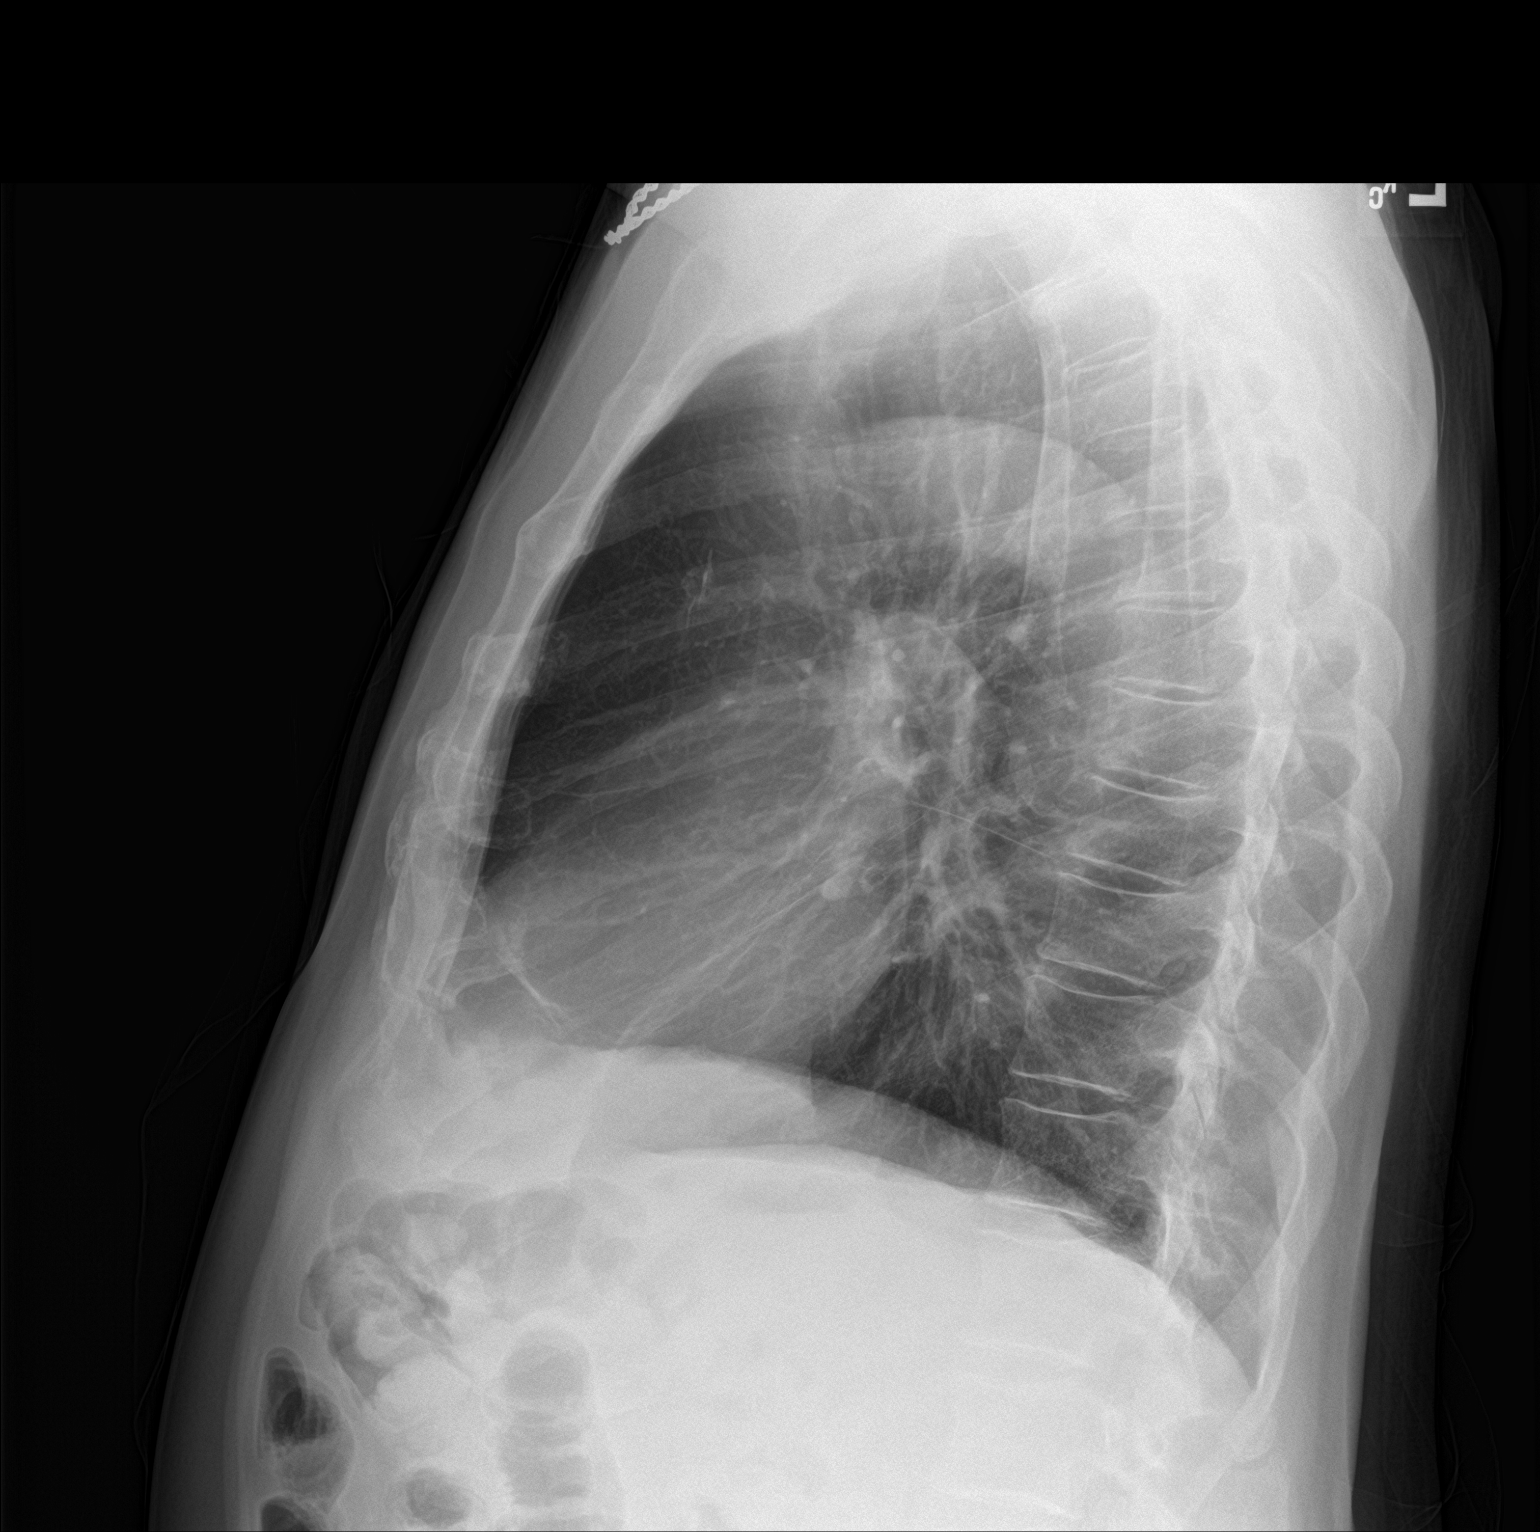

[2 of 2 positions shown; findings below may reference images not displayed]

FINDINGS: The heart size and mediastinal contours are within normal limits.
Both lungs are clear. The visualized skeletal structures are
unremarkable. Minimal scarring at the lingula
IMPRESSION: No active cardiopulmonary disease.

## 2020-01-11 ENCOUNTER — Ambulatory Visit (INDEPENDENT_AMBULATORY_CARE_PROVIDER_SITE_OTHER): Payer: Medicare PPO | Admitting: Cardiology

## 2020-01-11 ENCOUNTER — Encounter: Payer: Self-pay | Admitting: Cardiology

## 2020-01-11 ENCOUNTER — Other Ambulatory Visit: Payer: Self-pay

## 2020-01-11 ENCOUNTER — Ambulatory Visit (INDEPENDENT_AMBULATORY_CARE_PROVIDER_SITE_OTHER): Payer: Medicare PPO

## 2020-01-11 VITALS — BP 140/82 | HR 84 | Ht 66.0 in | Wt 164.4 lb

## 2020-01-11 DIAGNOSIS — I10 Essential (primary) hypertension: Secondary | ICD-10-CM

## 2020-01-11 DIAGNOSIS — R0600 Dyspnea, unspecified: Secondary | ICD-10-CM

## 2020-01-11 DIAGNOSIS — R002 Palpitations: Secondary | ICD-10-CM

## 2020-01-11 DIAGNOSIS — E7800 Pure hypercholesterolemia, unspecified: Secondary | ICD-10-CM

## 2020-01-11 NOTE — Patient Instructions (Signed)
Medication Instructions:   Your physician recommends that you continue on your current medications as directed. Please refer to the Current Medication list given to you today.  *If you need a refill on your cardiac medications before your next appointment, please call your pharmacy*   Lab Work: None Ordered If you have labs (blood work) drawn today and your tests are completely normal, you will receive your results only by: Marland Kitchen MyChart Message (if you have MyChart) OR . A paper copy in the mail If you have any lab test that is abnormal or we need to change your treatment, we will call you to review the results.   Testing/Procedures:  1.  Your physician has requested that you have an echocardiogram. Echocardiography is a painless test that uses sound waves to create images of your heart. It provides your doctor with information about the size and shape of your heart and how well your heart's chambers and valves are working. This procedure takes approximately one hour. There are no restrictions for this procedure.  2.  Your physician has recommended that you wear a Zio monitor. This monitor is a medical device that records the heart's electrical activity. Doctors most often use these monitors to diagnose arrhythmias. Arrhythmias are problems with the speed or rhythm of the heartbeat. The monitor is a small device applied to your chest. You can wear one while you do your normal daily activities. While wearing this monitor if you have any symptoms to push the button and record what you felt. Once you have worn this monitor for the period of time provider prescribed (Usually 14 days), you will return the monitor device in the postage paid box. Once it is returned they will download the data collected and provide [place] with a report which the provider will then review and we will call you with those results. Important tips:  1. Avoid showering during the first 24 hours of wearing the monitor. 2. Avoid  excessive sweating to help maximize wear time. 3. Do not submerge the device, no hot tubs, and no swimming pools. 4. Keep any lotions or oils away from the patch. 5. After 24 hours you may shower with the patch on. Take brief showers with your back facing the shower head.  6. Do not remove patch once it has been placed because that will interrupt data and decrease adhesive wear time. 7. Push the button when you have any symptoms and write down what you were feeling. 8. Once you have completed wearing your monitor, remove and place into box which has postage paid and place in your outgoing mailbox.  9. If for some reason you have misplaced your box then call our office and we can provide another box and/or mail it off for you.          Follow-Up: At [facility], you and your health needs are our priority.  As part of our continuing mission to provide you with exceptional heart care, we have created designated Provider Care Teams.  These Care Teams include your primary Cardiologist (physician) and Advanced Practice Providers (APPs -  Physician Assistants and Nurse Practitioners) who all work together to provide you with the care you need, when you need it.  We recommend signing up for the patient portal called "MyChart".  Sign up information is provided on this After Visit Summary.  MyChart is used to connect with patients for Virtual Visits (Telemedicine).  Patients are able to view lab/test results, encounter notes, upcoming appointments,  etc.  Non-urgent messages can be sent to your provider as well.   To learn more about what you can do with MyChart, go to [email or web address].    Your next appointment:   5 week(s)  The format for your next appointment:   In Person  Provider:   Kate Sable, MD   Other Instructions   Echocardiogram An echocardiogram is a procedure that uses painless sound waves (ultrasound) to produce an image of the heart. Images from an  echocardiogram can provide important information about:  Signs of coronary artery disease (CAD).  Aneurysm detection. An aneurysm is a weak or damaged part of an artery wall that bulges out from the normal force of blood pumping through the body.  Heart size and shape. Changes in the size or shape of the heart can be associated with certain conditions, including heart failure, aneurysm, and CAD.  Heart muscle function.  Heart valve function.  Signs of a past heart attack.  Fluid buildup around the heart.  Thickening of the heart muscle.  A tumor or infectious growth around the heart valves. Tell a health care provider about:  Any allergies you have.  All medicines you are taking, including vitamins, herbs, eye drops, creams, and over-the-counter medicines.  Any blood disorders you have.  Any surgeries you have had.  Any medical conditions you have.  Whether you are pregnant or may be pregnant. What are the risks? Generally, this is a safe procedure. However, problems may occur, including:  Allergic reaction to dye (contrast) that may be used during the procedure. What happens before the procedure? No specific preparation is needed. You may eat and drink normally. What happens during the procedure?   An IV tube may be inserted into one of your veins.  You may receive contrast through this tube. A contrast is an injection that improves the quality of the pictures from your heart.  A gel will be applied to your chest.  A wand-like tool (transducer) will be moved over your chest. The gel will help to transmit the sound waves from the transducer.  The sound waves will harmlessly bounce off of your heart to allow the heart images to be captured in real-time motion. The images will be recorded on a computer. The procedure may vary among health care providers and hospitals. What happens after the procedure?  You may return to your normal, everyday life, including diet,  activities, and medicines, unless your health care provider tells you not to do that. Summary  An echocardiogram is a procedure that uses painless sound waves (ultrasound) to produce an image of the heart.  Images from an echocardiogram can provide important information about the size and shape of your heart, heart muscle function, heart valve function, and fluid buildup around your heart.  You do not need to do anything to prepare before this procedure. You may eat and drink normally.  After the echocardiogram is completed, you may return to your normal, everyday life, unless your health care provider tells you not to do that. This information is not intended to replace advice given to you by your health care provider. Make sure you discuss any questions you have with your health care provider. Document Revised: 10/01/2018 Document Reviewed: 07/13/2016 Elsevier Patient Education  [place].

## 2020-01-11 NOTE — Progress Notes (Signed)
Cardiology Office Note:    Date:  01/11/2020   ID:  Henry Frazier, DOB [birth year], MRN [id number]  PCP:  Idelle Crouch, MD  [facility] Cardiologist:  Kate Sable, MD  [place] Electrophysiologist:  None   Referring MD: Idelle Crouch, MD   Chief Complaint  Patient presents with  . New Patient (Initial Visit)    DOE and palpitations; Meds verbally reviewed with patient.    History of Present Illness:    Henry Frazier is a 85 y.o. male with a hx of hypertension, diabetes, hyperlipidemia who presents due to palpitations and shortness of breath.  Patient states having symptoms of palpitations for over 6 years now.  He was originally on atenolol but this was stopped due to bradycardia with heart rates in the 40s.  Over the past couple of months, he has noticed occasional fast heart rates associated with shortness of breath.  Symptoms alcohol about 2-3 times a week for the last couple of minutes.  Symptoms sometimes come about when he moves too quickly or exerts himself.  Denies any chest pain, orthopnea.  Denies any history of heart disease.  He is not sure what he takes for blood pressure control.  Past Medical History:  Diagnosis Date  . Acid reflux   . Arthritis   . Diabetes mellitus without complication ([place])   . Heart murmur   . Hypertension   . Prostate cancer ([place])   . Pure hypercholesterolemia     Past Surgical History:  Procedure Laterality Date  . PROSTATE SURGERY      Current Medications: Current Meds  Medication Sig  . acetaminophen (TYLENOL) 500 MG tablet Take 500 mg by mouth every 6 (six) hours as needed.  Marland Kitchen amLODipine (NORVASC) 5 MG tablet Take 5 mg by mouth in the morning and at bedtime.  . cetirizine (ZYRTEC) 10 MG tablet Take 10 mg by mouth daily as needed for allergies.  . cloNIDine (CATAPRES) 0.2 MG tablet Take 0.2 mg by mouth 3 (three) times daily.   . fluticasone (FLONASE) 50 MCG/ACT nasal spray Place 2 sprays into both nostrils  daily as needed for allergies or rhinitis.  Marland Kitchen glimepiride (AMARYL) 4 MG tablet Take 4 mg by mouth daily with breakfast.  . glucose blood test strip 1 each by Other route as needed for other. Use as instructed  . losartan (COZAAR) 50 MG tablet Take 50 mg by mouth in the morning and at bedtime.  . lovastatin (MEVACOR) 40 MG tablet Take 40 mg by mouth at bedtime.  . meclizine (ANTIVERT) 25 MG tablet Take 25 mg by mouth 2 (two) times daily.  . metFORMIN (GLUCOPHAGE-XR) 750 MG 24 hr tablet Take 750 mg by mouth daily with breakfast.  . omeprazole (PRILOSEC) 20 MG capsule Take 20 mg by mouth daily.     Allergies:   Lisinopril   Social History   Socioeconomic History  . Marital status: Married    Spouse name: Not on file  . Number of children: Not on file  . Years of education: Not on file  . Highest education level: Not on file  Occupational History  . Not on file  Tobacco Use  . Smoking status: Never Smoker  . Smokeless tobacco: Former Systems developer    Types: Secondary school teacher  . Vaping Use: Never used  Substance and Sexual Activity  . Alcohol use: No  . Drug use: Never  . Sexual activity: Not on file  Other Topics Concern  .  Not on file  Social History Narrative  . Not on file   Social Determinants of Health   Financial Resource Strain:   . Difficulty of Paying Living Expenses:   Food Insecurity:   . Worried About Charity fundraiser in the Last Year:   . Arboriculturist in the Last Year:   Transportation Needs:   . Film/video editor (Medical):   Marland Kitchen Lack of Transportation (Non-Medical):   Physical Activity:   . Days of Exercise per Week:   . Minutes of Exercise per Session:   Stress:   . Feeling of Stress :   Social Connections:   . Frequency of Communication with Friends and Family:   . Frequency of Social Gatherings with Friends and Family:   . Attends Religious Services:   . Active Member of Clubs or Organizations:   . Attends Archivist Meetings:   Marland Kitchen  Marital Status:      Family History: The patient's family history includes Colon cancer in his mother; Heart attack (age of onset: 84) in his father.  ROS:   Please see the history of present illness.     All other systems reviewed and are negative.  EKGs/Labs/Other Studies Reviewed:    The following studies were reviewed today:   EKG:  EKG is  ordered today.  The ekg ordered today demonstrates sinus rhythm, first-degree AV block  Recent Labs: No results found for requested labs within last 8760 hours.  Recent Lipid Panel No results found for: CHOL, TRIG, HDL, CHOLHDL, VLDL, LDLCALC, LDLDIRECT  Physical Exam:    VS:  BP 140/82 (BP Location: Right Arm, Patient Position: Sitting, Cuff Size: Normal)   Pulse 84   Ht 5\' 6"  (1.676 m)   Wt 164 lb 6 oz (74.6 kg)   SpO2 98%   BMI 26.53 kg/m     Wt Readings from Last 3 Encounters:  01/11/20 164 lb 6 oz (74.6 kg)  08/15/18 160 lb (72.6 kg)  08/21/17 163 lb (73.9 kg)     GEN:  Well nourished, well developed in no acute distress HEENT: Normal NECK: No JVD; No carotid bruits LYMPHATICS: No lymphadenopathy CARDIAC: RRR, no murmurs, rubs, gallops RESPIRATORY:  Clear to auscultation without rales, wheezing or rhonchi  ABDOMEN: Soft, non-tender, non-distended MUSCULOSKELETAL:  No edema; No deformity  SKIN: Warm and dry NEUROLOGIC:  Alert and oriented x 3 PSYCHIATRIC:  Normal affect   ASSESSMENT:    1. Palpitations   2. Dyspnea, unspecified type   3. Essential hypertension   4. Pure hypercholesterolemia    PLAN:    In order of problems listed above:  1. Patient with occasional palpitations/rapid heart rates over the past couple of months.  Was previously on a beta-blocker but this was stopped due to significant bradycardia.  Will place a 2-week cardiac monitor to evaluate any significant arrhythmias.  Will consider trial of Toprol-XL if no significant arrhythmias are noted. 2. Patient with shortness of breath associated  with palpitations.  Get echocardiogram to evaluate any gross cardiac dysfunction. 3. History of hypertension, blood pressure reasonably controlled.  Continue current BP meds for now.  Office will reach out to pharmacy to determine what patient is actually taking to guide medication titration if needed. 4. History of hyperlipidemia, continue statin.  Follow-up after echo and cardiac monitor.  This note was generated in part or whole with voice recognition software. Voice recognition is usually quite accurate but there are transcription errors that can and  very often do occur. I apologize for any typographical errors that were not detected and corrected.  Medication Adjustments/Labs and Tests Ordered: Current medicines are reviewed at length with the patient today.  Concerns regarding medicines are outlined above.  Orders Placed This Encounter  Procedures  . LONG TERM MONITOR (3-14 DAYS)  . EKG 12-Lead  . ECHOCARDIOGRAM COMPLETE   No orders of the defined types were placed in this encounter.   Patient Instructions  Medication Instructions:   Your physician recommends that you continue on your current medications as directed. Please refer to the Current Medication list given to you today.  *If you need a refill on your cardiac medications before your next appointment, please call your pharmacy*   Lab Work: None Ordered If you have labs (blood work) drawn today and your tests are completely normal, you will receive your results only by: Marland Kitchen MyChart Message (if you have MyChart) OR . A paper copy in the mail If you have any lab test that is abnormal or we need to change your treatment, we will call you to review the results.   Testing/Procedures:  1.  Your physician has requested that you have an echocardiogram. Echocardiography is a painless test that uses sound waves to create images of your heart. It provides your doctor with information about the size and shape of your heart and how  well your heart's chambers and valves are working. This procedure takes approximately one hour. There are no restrictions for this procedure.  2.  Your physician has recommended that you wear a Zio monitor. This monitor is a medical device that records the heart's electrical activity. Doctors most often use these monitors to diagnose arrhythmias. Arrhythmias are problems with the speed or rhythm of the heartbeat. The monitor is a small device applied to your chest. You can wear one while you do your normal daily activities. While wearing this monitor if you have any symptoms to push the button and record what you felt. Once you have worn this monitor for the period of time provider prescribed (Usually 14 days), you will return the monitor device in the postage paid box. Once it is returned they will download the data collected and provide [place] with a report which the provider will then review and we will call you with those results. Important tips:  1. Avoid showering during the first 24 hours of wearing the monitor. 2. Avoid excessive sweating to help maximize wear time. 3. Do not submerge the device, no hot tubs, and no swimming pools. 4. Keep any lotions or oils away from the patch. 5. After 24 hours you may shower with the patch on. Take brief showers with your back facing the shower head.  6. Do not remove patch once it has been placed because that will interrupt data and decrease adhesive wear time. 7. Push the button when you have any symptoms and write down what you were feeling. 8. Once you have completed wearing your monitor, remove and place into box which has postage paid and place in your outgoing mailbox.  9. If for some reason you have misplaced your box then call our office and we can provide another box and/or mail it off for you.          Follow-Up: At [facility], you and your health needs are our priority.  As part of our continuing mission to provide you with exceptional  heart care, we have created designated Provider Care Teams.  These Care  Teams include your primary Cardiologist (physician) and Advanced Practice Providers (APPs -  Physician Assistants and Nurse Practitioners) who all work together to provide you with the care you need, when you need it.  We recommend signing up for the patient portal called "MyChart".  Sign up information is provided on this After Visit Summary.  MyChart is used to connect with patients for Virtual Visits (Telemedicine).  Patients are able to view lab/test results, encounter notes, upcoming appointments, etc.  Non-urgent messages can be sent to your provider as well.   To learn more about what you can do with MyChart, go to [email or web address].    Your next appointment:   5 week(s)  The format for your next appointment:   In Person  Provider:   Kate Sable, MD   Other Instructions   Echocardiogram An echocardiogram is a procedure that uses painless sound waves (ultrasound) to produce an image of the heart. Images from an echocardiogram can provide important information about:  Signs of coronary artery disease (CAD).  Aneurysm detection. An aneurysm is a weak or damaged part of an artery wall that bulges out from the normal force of blood pumping through the body.  Heart size and shape. Changes in the size or shape of the heart can be associated with certain conditions, including heart failure, aneurysm, and CAD.  Heart muscle function.  Heart valve function.  Signs of a past heart attack.  Fluid buildup around the heart.  Thickening of the heart muscle.  A tumor or infectious growth around the heart valves. Tell a health care provider about:  Any allergies you have.  All medicines you are taking, including vitamins, herbs, eye drops, creams, and over-the-counter medicines.  Any blood disorders you have.  Any surgeries you have had.  Any medical conditions you have.  Whether you are  pregnant or may be pregnant. What are the risks? Generally, this is a safe procedure. However, problems may occur, including:  Allergic reaction to dye (contrast) that may be used during the procedure. What happens before the procedure? No specific preparation is needed. You may eat and drink normally. What happens during the procedure?   An IV tube may be inserted into one of your veins.  You may receive contrast through this tube. A contrast is an injection that improves the quality of the pictures from your heart.  A gel will be applied to your chest.  A wand-like tool (transducer) will be moved over your chest. The gel will help to transmit the sound waves from the transducer.  The sound waves will harmlessly bounce off of your heart to allow the heart images to be captured in real-time motion. The images will be recorded on a computer. The procedure may vary among health care providers and hospitals. What happens after the procedure?  You may return to your normal, everyday life, including diet, activities, and medicines, unless your health care provider tells you not to do that. Summary  An echocardiogram is a procedure that uses painless sound waves (ultrasound) to produce an image of the heart.  Images from an echocardiogram can provide important information about the size and shape of your heart, heart muscle function, heart valve function, and fluid buildup around your heart.  You do not need to do anything to prepare before this procedure. You may eat and drink normally.  After the echocardiogram is completed, you may return to your normal, everyday life, unless your health care provider tells you not to do  that. This information is not intended to replace advice given to you by your health care provider. Make sure you discuss any questions you have with your health care provider. Document Revised: 10/01/2018 Document Reviewed: 07/13/2016 Elsevier Patient Education  2020  [place] Part, Kate Sable, MD  01/11/2020 10:38 AM    [place]

## 2020-02-02 ENCOUNTER — Other Ambulatory Visit: Payer: Self-pay | Admitting: Cardiology

## 2020-02-02 DIAGNOSIS — R0600 Dyspnea, unspecified: Secondary | ICD-10-CM

## 2020-02-09 ENCOUNTER — Telehealth: Payer: Self-pay

## 2020-02-09 ENCOUNTER — Other Ambulatory Visit: Payer: Self-pay

## 2020-02-09 ENCOUNTER — Ambulatory Visit (INDEPENDENT_AMBULATORY_CARE_PROVIDER_SITE_OTHER): Payer: Medicare PPO

## 2020-02-09 DIAGNOSIS — R0600 Dyspnea, unspecified: Secondary | ICD-10-CM | POA: Diagnosis not present

## 2020-02-09 LAB — ECHOCARDIOGRAM COMPLETE
AR max vel: 3.85 cm2
AV Area VTI: 3.99 cm2
AV Area mean vel: 3.63 cm2
AV Mean grad: 3 mmHg
AV Peak grad: 5.6 mmHg
Ao pk vel: 1.18 m/s
Area-P 1/2: 3.21 cm2
Calc EF: 50.8 %
P 1/2 time: 778 msec
S' Lateral: 2.3 cm
Single Plane A2C EF: 48.7 %
Single Plane A4C EF: 55.1 %

## 2020-02-09 NOTE — Telephone Encounter (Signed)
Call to patient to review echo.    Left detailed message per DPR.    Advised pt to call for any further questions or concerns.  No further orders.

## 2020-02-09 NOTE — Telephone Encounter (Signed)
-----   Message from Kate Sable, MD sent at 02/09/2020  2:49 PM EDT ----- Normal systolic function, impaired relaxation, mild AI. Overall, okay echo with no gross abnormal findings to explain shortness of breath.

## 2020-02-18 ENCOUNTER — Other Ambulatory Visit: Payer: Self-pay

## 2020-02-18 ENCOUNTER — Ambulatory Visit (INDEPENDENT_AMBULATORY_CARE_PROVIDER_SITE_OTHER): Payer: Medicare PPO | Admitting: Cardiology

## 2020-02-18 ENCOUNTER — Encounter: Payer: Self-pay | Admitting: Cardiology

## 2020-02-18 VITALS — BP 130/68 | HR 82 | Ht 66.0 in | Wt 165.2 lb

## 2020-02-18 DIAGNOSIS — I351 Nonrheumatic aortic (valve) insufficiency: Secondary | ICD-10-CM

## 2020-02-18 DIAGNOSIS — R002 Palpitations: Secondary | ICD-10-CM

## 2020-02-18 DIAGNOSIS — E7800 Pure hypercholesterolemia, unspecified: Secondary | ICD-10-CM

## 2020-02-18 DIAGNOSIS — I10 Essential (primary) hypertension: Secondary | ICD-10-CM | POA: Diagnosis not present

## 2020-02-18 NOTE — Progress Notes (Signed)
Cardiology Office Note:    Date:  02/18/2020   ID:  Henry Frazier, DOB Jan 14, 1935, MRN [id number]  PCP:  Idelle Crouch, MD  [facility] Cardiologist:  Kate Sable, MD  [place] Electrophysiologist:  None   Referring MD: Idelle Crouch, MD   Chief Complaint  Patient presents with  . Follow-up    Follow up for Zio Results. Medications verbally reviewed with patient.     History of Present Illness:    Henry Frazier is a 85 y.o. male with a hx of hypertension, diabetes, hyperlipidemia who presents for follow-up.  Patient was last seen due to palpitations and shortness of breath.  Symptoms occurring 2-3 times a week associated with shortness of breath.  Was previously taking the beta-blocker but this was stopped due to bradycardia.  Echocardiogram was performed to evaluate shortness of breath, cardiac monitor was placed to evaluate any significant arrhythmias.  Patient now presents for results, has no new complaints today.  States his shortness of breath is not a concern, actually feels well.   Past Medical History:  Diagnosis Date  . Acid reflux   . Arthritis   . Diabetes mellitus without complication ([place])   . Heart murmur   . Hypertension   . Prostate cancer ([place])   . Pure hypercholesterolemia     Past Surgical History:  Procedure Laterality Date  . PROSTATE SURGERY      Current Medications: Current Meds  Medication Sig  . acetaminophen (TYLENOL) 500 MG tablet Take 500 mg by mouth every 6 (six) hours as needed.  Marland Kitchen amLODipine (NORVASC) 5 MG tablet Take 1 tablet (5 mg total) by mouth 2 (two) times daily as needed. (Patient taking differently: Take 5 mg by mouth at bedtime. )  . amLODipine (NORVASC) 5 MG tablet Take 5 mg by mouth in the morning and at bedtime.  Marland Kitchen atenolol (TENORMIN) 25 MG tablet Take 25 mg by mouth 2 (two) times daily.   . cetirizine (ZYRTEC) 10 MG tablet Take 10 mg by mouth daily as needed for allergies.  . cloNIDine (CATAPRES) 0.2  MG tablet Take 0.2 mg by mouth 3 (three) times daily.   . fluticasone (FLONASE) 50 MCG/ACT nasal spray Place 2 sprays into both nostrils daily as needed for allergies or rhinitis.  Marland Kitchen glimepiride (AMARYL) 4 MG tablet Take 4 mg by mouth daily with breakfast.  . glucose blood test strip 1 each by Other route as needed for other. Use as instructed  . losartan (COZAAR) 50 MG tablet Take 50 mg by mouth in the morning and at bedtime.  . lovastatin (MEVACOR) 40 MG tablet Take 40 mg by mouth at bedtime.  . meclizine (ANTIVERT) 25 MG tablet Take 25 mg by mouth 2 (two) times daily.  . metFORMIN (GLUCOPHAGE-XR) 750 MG 24 hr tablet Take 750 mg by mouth daily with breakfast.  . omeprazole (PRILOSEC) 20 MG capsule Take 20 mg by mouth daily.  Marland Kitchen triamcinolone cream (KENALOG) 0.5 % Apply topically.  . [DISCONTINUED] losartan (COZAAR) 50 MG tablet Take 50 mg by mouth daily.   . [DISCONTINUED] meclizine (ANTIVERT) 25 MG tablet TAKE 1 TABLET (25 MG TOTAL) BY MOUTH 3 (THREE) TIMES DAILY AS NEEDED.     Allergies:   Lisinopril   Social History   Socioeconomic History  . Marital status: Married    Spouse name: Not on file  . Number of children: Not on file  . Years of education: Not on file  . Highest education  level: Not on file  Occupational History  . Not on file  Tobacco Use  . Smoking status: Never Smoker  . Smokeless tobacco: Former Systems developer    Types: Secondary school teacher  . Vaping Use: Never used  Substance and Sexual Activity  . Alcohol use: No  . Drug use: Never  . Sexual activity: Not on file  Other Topics Concern  . Not on file  Social History Narrative  . Not on file   Social Determinants of Health   Financial Resource Strain:   . Difficulty of Paying Living Expenses: Not on file  Food Insecurity:   . Worried About Charity fundraiser in the Last Year: Not on file  . Ran Out of Food in the Last Year: Not on file  Transportation Needs:   . Lack of Transportation (Medical): Not on file  .  Lack of Transportation (Non-Medical): Not on file  Physical Activity:   . Days of Exercise per Week: Not on file  . Minutes of Exercise per Session: Not on file  Stress:   . Feeling of Stress : Not on file  Social Connections:   . Frequency of Communication with Friends and Family: Not on file  . Frequency of Social Gatherings with Friends and Family: Not on file  . Attends Religious Services: Not on file  . Active Member of Clubs or Organizations: Not on file  . Attends Archivist Meetings: Not on file  . Marital Status: Not on file     Family History: The patient's family history includes Colon cancer in his mother; Heart attack (age of onset: 48) in his father.  ROS:   Please see the history of present illness.     All other systems reviewed and are negative.  EKGs/Labs/Other Studies Reviewed:    The following studies were reviewed today:   EKG:  EKG is  ordered today.  The ekg ordered today demonstrates sinus rhythm, first-degree AV block  Recent Labs: No results found for requested labs within last 8760 hours.  Recent Lipid Panel No results found for: CHOL, TRIG, HDL, CHOLHDL, VLDL, LDLCALC, LDLDIRECT  Physical Exam:    VS:  BP 130/68 (BP Location: Left Arm, Patient Position: Sitting, Cuff Size: Normal)   Pulse 82   Ht 5\' 6"  (1.676 m)   Wt 165 lb 3.2 oz (74.9 kg)   SpO2 98%   BMI 26.66 kg/m     Wt Readings from Last 3 Encounters:  02/18/20 165 lb 3.2 oz (74.9 kg)  01/11/20 164 lb 6 oz (74.6 kg)  08/15/18 160 lb (72.6 kg)     GEN:  Well nourished, well developed in no acute distress HEENT: Normal NECK: No JVD; No carotid bruits LYMPHATICS: No lymphadenopathy CARDIAC: RRR, no murmurs, rubs, gallops RESPIRATORY:  Clear to auscultation without rales, wheezing or rhonchi  ABDOMEN: Soft, non-tender, non-distended MUSCULOSKELETAL:  No edema; No deformity  SKIN: Warm and dry NEUROLOGIC:  Alert and oriented x 3 PSYCHIATRIC:  Normal affect    ASSESSMENT:    1. Palpitations   2. Essential hypertension   3. Pure hypercholesterolemia   4. Aortic valve insufficiency, etiology of cardiac valve disease unspecified    PLAN:    In order of problems listed above:  1. Patient with occasional palpitations/rapid heart rates over the past couple of months.  2-week cardiac monitor did not show any significant arrhythmias, patient triggered events were associated with sinus rhythm.  Echocardiogram showed normal systolic function, EF 55  to 60%.  Mild AI.  Patient reassured.  Okay to continue atenolol as prescribed. 2. History of hypertension, blood pressure controlled.  Continue losartan, amlodipine, atenolol. 3. History of hyperlipidemia, continue statin. 4. Mild aortic valve regurgitation noted on echocardiogram.  EF normal.  No need for repeat echocardiogram for another 3 to 5 years according to [facility] AHA guidelines.  Follow-up in 1 year.  Total encounter time 40 minutes  Greater than 50% was spent in counseling and coordination of care with the patient   This note was generated in part or whole with voice recognition software. Voice recognition is usually quite accurate but there are transcription errors that can and very often do occur. I apologize for any typographical errors that were not detected and corrected.  Medication Adjustments/Labs and Tests Ordered: Current medicines are reviewed at length with the patient today.  Concerns regarding medicines are outlined above.  Orders Placed This Encounter  Procedures  . EKG 12-Lead   No orders of the defined types were placed in this encounter.   Patient Instructions  Medication Instructions:  Your physician recommends that you continue on your current medications as directed. Please refer to the Current Medication list given to you today.  *If you need a refill on your cardiac medications before your next appointment, please call your pharmacy*   Lab Work: None Ordered If  you have labs (blood work) drawn today and your tests are completely normal, you will receive your results only by: Marland Kitchen MyChart Message (if you have MyChart) OR . A paper copy in the mail If you have any lab test that is abnormal or we need to change your treatment, we will call you to review the results.   Testing/Procedures: None Ordered   Follow-Up: At [facility], you and your health needs are our priority.  As part of our continuing mission to provide you with exceptional heart care, we have created designated Provider Care Teams.  These Care Teams include your primary Cardiologist (physician) and Advanced Practice Providers (APPs -  Physician Assistants and Nurse Practitioners) who all work together to provide you with the care you need, when you need it.  We recommend signing up for the patient portal called "MyChart".  Sign up information is provided on this After Visit Summary.  MyChart is used to connect with patients for Virtual Visits (Telemedicine).  Patients are able to view lab/test results, encounter notes, upcoming appointments, etc.  Non-urgent messages can be sent to your provider as well.   To learn more about what you can do with MyChart, go to [email or web address].    Your next appointment:   1 year(s)  The format for your next appointment:   In Person  Provider:   Kate Sable, MD   Other Instructions     Signed, Kate Sable, MD  02/18/2020 11:55 AM    [place]

## 2020-02-18 NOTE — Patient Instructions (Signed)

## 2021-07-18 DIAGNOSIS — E113293 Type 2 diabetes mellitus with mild nonproliferative diabetic retinopathy without macular edema, bilateral: Secondary | ICD-10-CM | POA: Diagnosis not present

## 2021-07-18 DIAGNOSIS — H524 Presbyopia: Secondary | ICD-10-CM | POA: Diagnosis not present

## 2021-07-18 DIAGNOSIS — E1136 Type 2 diabetes mellitus with diabetic cataract: Secondary | ICD-10-CM | POA: Diagnosis not present

## 2021-08-02 DIAGNOSIS — Z79899 Other long term (current) drug therapy: Secondary | ICD-10-CM | POA: Diagnosis not present

## 2021-08-02 DIAGNOSIS — E118 Type 2 diabetes mellitus with unspecified complications: Secondary | ICD-10-CM | POA: Diagnosis not present

## 2021-08-02 DIAGNOSIS — I10 Essential (primary) hypertension: Secondary | ICD-10-CM | POA: Diagnosis not present

## 2021-08-02 DIAGNOSIS — E7800 Pure hypercholesterolemia, unspecified: Secondary | ICD-10-CM | POA: Diagnosis not present

## 2021-08-09 DIAGNOSIS — E119 Type 2 diabetes mellitus without complications: Secondary | ICD-10-CM | POA: Diagnosis not present

## 2021-08-09 DIAGNOSIS — Z23 Encounter for immunization: Secondary | ICD-10-CM | POA: Diagnosis not present

## 2021-08-09 DIAGNOSIS — Z79899 Other long term (current) drug therapy: Secondary | ICD-10-CM | POA: Diagnosis not present

## 2021-08-09 DIAGNOSIS — E785 Hyperlipidemia, unspecified: Secondary | ICD-10-CM | POA: Diagnosis not present

## 2021-08-09 DIAGNOSIS — Z0000 Encounter for general adult medical examination without abnormal findings: Secondary | ICD-10-CM | POA: Diagnosis not present

## 2021-08-09 DIAGNOSIS — D649 Anemia, unspecified: Secondary | ICD-10-CM | POA: Diagnosis not present

## 2021-08-09 DIAGNOSIS — I10 Essential (primary) hypertension: Secondary | ICD-10-CM | POA: Diagnosis not present

## 2021-10-30 DIAGNOSIS — E118 Type 2 diabetes mellitus with unspecified complications: Secondary | ICD-10-CM | POA: Diagnosis not present

## 2021-10-30 DIAGNOSIS — Z79899 Other long term (current) drug therapy: Secondary | ICD-10-CM | POA: Diagnosis not present

## 2021-11-06 DIAGNOSIS — E118 Type 2 diabetes mellitus with unspecified complications: Secondary | ICD-10-CM | POA: Diagnosis not present

## 2021-11-06 DIAGNOSIS — Z125 Encounter for screening for malignant neoplasm of prostate: Secondary | ICD-10-CM | POA: Diagnosis not present

## 2021-11-06 DIAGNOSIS — E782 Mixed hyperlipidemia: Secondary | ICD-10-CM | POA: Diagnosis not present

## 2021-11-06 DIAGNOSIS — I10 Essential (primary) hypertension: Secondary | ICD-10-CM | POA: Diagnosis not present

## 2021-11-06 DIAGNOSIS — Z79899 Other long term (current) drug therapy: Secondary | ICD-10-CM | POA: Diagnosis not present

## 2021-11-06 DIAGNOSIS — D649 Anemia, unspecified: Secondary | ICD-10-CM | POA: Diagnosis not present

## 2022-01-30 DIAGNOSIS — I10 Essential (primary) hypertension: Secondary | ICD-10-CM | POA: Diagnosis not present

## 2022-01-30 DIAGNOSIS — E782 Mixed hyperlipidemia: Secondary | ICD-10-CM | POA: Diagnosis not present

## 2022-01-30 DIAGNOSIS — Z79899 Other long term (current) drug therapy: Secondary | ICD-10-CM | POA: Diagnosis not present

## 2022-01-30 DIAGNOSIS — E118 Type 2 diabetes mellitus with unspecified complications: Secondary | ICD-10-CM | POA: Diagnosis not present

## 2022-01-30 DIAGNOSIS — Z125 Encounter for screening for malignant neoplasm of prostate: Secondary | ICD-10-CM | POA: Diagnosis not present

## 2022-02-01 ENCOUNTER — Telehealth: Payer: Self-pay | Admitting: Cardiology

## 2022-02-01 NOTE — Telephone Encounter (Signed)
Have made several attempts to schedule with no success Deleted recall 

## 2022-02-06 DIAGNOSIS — E119 Type 2 diabetes mellitus without complications: Secondary | ICD-10-CM | POA: Diagnosis not present

## 2022-02-06 DIAGNOSIS — D649 Anemia, unspecified: Secondary | ICD-10-CM | POA: Diagnosis not present

## 2022-02-06 DIAGNOSIS — Z79899 Other long term (current) drug therapy: Secondary | ICD-10-CM | POA: Diagnosis not present

## 2022-02-06 DIAGNOSIS — I10 Essential (primary) hypertension: Secondary | ICD-10-CM | POA: Diagnosis not present

## 2022-02-06 DIAGNOSIS — E782 Mixed hyperlipidemia: Secondary | ICD-10-CM | POA: Diagnosis not present

## 2022-08-08 DIAGNOSIS — E118 Type 2 diabetes mellitus with unspecified complications: Secondary | ICD-10-CM | POA: Diagnosis not present

## 2022-08-08 DIAGNOSIS — E782 Mixed hyperlipidemia: Secondary | ICD-10-CM | POA: Diagnosis not present

## 2022-08-08 DIAGNOSIS — Z79899 Other long term (current) drug therapy: Secondary | ICD-10-CM | POA: Diagnosis not present

## 2022-08-08 DIAGNOSIS — I10 Essential (primary) hypertension: Secondary | ICD-10-CM | POA: Diagnosis not present

## 2022-08-15 DIAGNOSIS — Z1211 Encounter for screening for malignant neoplasm of colon: Secondary | ICD-10-CM | POA: Diagnosis not present

## 2022-08-15 DIAGNOSIS — I10 Essential (primary) hypertension: Secondary | ICD-10-CM | POA: Diagnosis not present

## 2022-08-15 DIAGNOSIS — Z0000 Encounter for general adult medical examination without abnormal findings: Secondary | ICD-10-CM | POA: Diagnosis not present

## 2022-08-15 DIAGNOSIS — D649 Anemia, unspecified: Secondary | ICD-10-CM | POA: Diagnosis not present

## 2022-08-15 DIAGNOSIS — E782 Mixed hyperlipidemia: Secondary | ICD-10-CM | POA: Diagnosis not present

## 2022-08-15 DIAGNOSIS — E118 Type 2 diabetes mellitus with unspecified complications: Secondary | ICD-10-CM | POA: Diagnosis not present

## 2022-08-15 DIAGNOSIS — Z79899 Other long term (current) drug therapy: Secondary | ICD-10-CM | POA: Diagnosis not present

## 2022-08-16 ENCOUNTER — Emergency Department: Payer: No Typology Code available for payment source

## 2022-08-16 ENCOUNTER — Emergency Department
Admission: EM | Admit: 2022-08-16 | Discharge: 2022-08-16 | Disposition: A | Discharge status: Home / Self Care | Payer: No Typology Code available for payment source | Attending: Emergency Medicine | Admitting: Emergency Medicine

## 2022-08-16 ENCOUNTER — Other Ambulatory Visit: Payer: Self-pay

## 2022-08-16 DIAGNOSIS — Z20822 Contact with and (suspected) exposure to covid-19: Secondary | ICD-10-CM | POA: Insufficient documentation

## 2022-08-16 DIAGNOSIS — B349 Viral infection, unspecified: Secondary | ICD-10-CM

## 2022-08-16 DIAGNOSIS — R059 Cough, unspecified: Secondary | ICD-10-CM | POA: Diagnosis not present

## 2022-08-16 LAB — RESP PANEL BY RT-PCR (RSV, FLU A&B, COVID)  RVPGX2
Influenza A by PCR: NEGATIVE
Influenza B by PCR: NEGATIVE
Resp Syncytial Virus by PCR: NEGATIVE
SARS Coronavirus 2 by RT PCR: NEGATIVE

## 2022-08-16 MED ORDER — PREDNISONE 20 MG PO TABS
60.0000 mg | ORAL_TABLET | Freq: Once | ORAL | Status: AC
  Administered 2022-08-16: 60 mg via ORAL
  Filled 2022-08-16: qty 3

## 2022-08-16 MED ORDER — PSEUDOEPH-BROMPHEN-DM 30-2-10 MG/5ML PO SYRP: 10.0000 mL | ORAL_SOLUTION | Freq: Four times a day (QID) | ORAL | 0 refills | Status: AC | PRN

## 2022-08-16 MED ORDER — BENZONATATE 100 MG PO CAPS: 100.0000 mg | ORAL_CAPSULE | Freq: Three times a day (TID) | ORAL | 0 refills | Status: AC | PRN

## 2022-08-16 MED ORDER — PREDNISONE 50 MG PO TABS: 50.0000 mg | ORAL_TABLET | Freq: Every day | ORAL | 0 refills | Status: AC

## 2022-08-16 MED ORDER — ALBUTEROL SULFATE HFA 108 (90 BASE) MCG/ACT IN AERS: 2.0000 | INHALATION_SPRAY | Freq: Four times a day (QID) | RESPIRATORY_TRACT | 0 refills | Status: AC | PRN

## 2022-08-16 NOTE — ED Provider Notes (Signed)
[facility] Provider Note  Patient Contact: 9:29 PM (approximate)   History   flu like symptoms    HPI  Henry Frazier is a 88 y.o. male who presents the emergency department complaining of cough, congestion.  Patient has had symptoms x 2 days.  Does have a history of previous pneumonia but he has no increased work of breathing, difficulty breathing or fevers.     Physical Exam   Triage Vital Signs: ED Triage Vitals [08/16/22 2049]  Enc Vitals Group     BP (!) 149/81     Pulse Rate (!) 112     Resp 18     Temp 98.9 F (37.2 C)     Temp Source Oral     SpO2 94 %     Weight 165 lb (74.8 kg)     Height '5\' 6"'$  (1.676 m)     Head Circumference      Peak Flow      Pain Score 0     Pain Loc      Pain Edu?      Excl. in [place]?     Most recent vital signs: Vitals:   08/16/22 2049  BP: (!) 149/81  Pulse: (!) 112  Resp: 18  Temp: 98.9 F (37.2 C)  SpO2: 94%     General: Alert and in no acute distress. ENT:      Ears:       Nose: No congestion/rhinnorhea.      Mouth/Throat: Mucous membranes are moist. Neck: No stridor. No cervical spine tenderness to palpation Hematological/Lymphatic/Immunilogical: No cervical lymphadenopathy. Cardiovascular:  Good peripheral perfusion Respiratory: Normal respiratory effort without tachypnea or retractions. Lungs with some mild coarse breath sounds in lower lung fields bilaterally.  No appreciable wheeze.  No rales or rhonchi.Kermit Balo air entry to the bases with no decreased or absent breath sounds Musculoskeletal: Full range of motion to all extremities.  Neurologic:  No gross focal neurologic deficits are appreciated.  Skin:   No rash noted Other:   ED Results / Procedures / Treatments   Labs (all labs ordered are listed, but only abnormal results are displayed) Labs Reviewed  RESP PANEL BY RT-PCR (RSV, FLU A&B, COVID)  RVPGX2     EKG     RADIOLOGY  I personally viewed, evaluated, and  interpreted these images as part of my medical decision making, as well as reviewing the written report by the radiologist.  ED Provider Interpretation: No acute consolidation concerning for pneumonia.  DG Chest Portable 1 View  Result Date: 08/16/2022 CLINICAL DATA:  Cough, congestion and flu symptoms. EXAM: PORTABLE CHEST 1 VIEW COMPARISON:  PA Lat 08/15/2018. FINDINGS: The heart size and mediastinal contours are within normal limits. Both lungs are clear. The visualized skeletal structures are unremarkable. IMPRESSION: No evidence of acute chest disease.  Stable chest. Electronically Signed   By: Telford Nab M.D.   On: 08/16/2022 21:08    PROCEDURES:  Critical Care performed: No  Procedures   MEDICATIONS ORDERED IN ED: Medications  predniSONE (DELTASONE) tablet 60 mg (has no administration in time range)     IMPRESSION / MDM / ASSESSMENT AND PLAN / ED COURSE  I reviewed the triage vital signs and the nursing notes.                                 Differential diagnosis includes, but is not limited  to, viral illness, flu, COVID, RSV, pneumonia, bronchitis  Patient's presentation is most consistent with acute presentation with potential threat to life or bodily function.   Patient's diagnosis is consistent with viral illness.  Patient presents emergency department with cough, congestion.  No fevers.  Patient has no difficulty breathing.  Patient is a very mild coarse breath sounds in the lower lung fields bilaterally.  No rales or rhonchi.  Chest x-ray revealed no evidence of pneumonia.  Viral panel was negative.  At this time we will treat with symptom control medication of cough medicines but given the coarse breath sounds will treat with prednisone and albuterol.  Concerning signs and symptoms and return precaution discussed with the patient and his daughter.  Follow-up primary care as needed.   Patient is given ED precautions to return to the ED for any worsening or new  symptoms.     FINAL CLINICAL IMPRESSION(S) / ED DIAGNOSES   Final diagnoses:  Viral illness     Rx / DC Orders   ED Discharge Orders          Ordered    predniSONE (DELTASONE) 50 MG tablet  Daily with breakfast        08/16/22 2158    albuterol (VENTOLIN HFA) 108 (90 Base) MCG/ACT inhaler  Every 6 hours PRN        08/16/22 2158    benzonatate (TESSALON PERLES) 100 MG capsule  3 times daily PRN        08/16/22 2158    brompheniramine-pseudoephedrine-DM 30-2-10 MG/5ML syrup  4 times daily PRN        08/16/22 2158             Note:  This document was prepared using Dragon voice recognition software and may include unintentional dictation errors.   Brynda Peon 08/16/22 2159    Rada Hay, MD 08/17/22 [phone]

## 2022-08-16 NOTE — ED Triage Notes (Signed)
Pt reports cough, congestion, and body aches since Wednesday. Denies fevers at home. Pt alert and oriented following commands. Breathing unlabored speaking in full sentences with symmetric chest rise and fall. Pt ambulatory in triage. Pt daughter reports concern for pneumonia and requesting CXR.

## 2022-08-21 ENCOUNTER — Telehealth: Payer: Self-pay

## 2022-08-21 NOTE — Telephone Encounter (Signed)
     Patient  visit on 08/16/2022  at [facility] was for flu like symptoms.  Have you been able to follow up with your primary care physician? Yes. Patient received a call from PCP.   The patient was or was not able to obtain any needed medicine or equipment. Patient did not fill prescription on advise from pharmacist not good for hypertension. Patient spoke to PCP about this took OTC medication and is feeling better.  Are there diet recommendations that you are having difficulty following? No  Patient expresses understanding of discharge instructions and education provided has no other needs at this time. Yes   [facility] Resource Care Guide   [email or web address]  ?? [id number]   Website: [email or web address]  [email or web address]

## 2022-08-26 DIAGNOSIS — Z1211 Encounter for screening for malignant neoplasm of colon: Secondary | ICD-10-CM | POA: Diagnosis not present

## 2022-09-04 ENCOUNTER — Emergency Department: Payer: No Typology Code available for payment source

## 2022-09-04 ENCOUNTER — Encounter: Payer: Self-pay | Admitting: Emergency Medicine

## 2022-09-04 DIAGNOSIS — Z1152 Encounter for screening for COVID-19: Secondary | ICD-10-CM | POA: Insufficient documentation

## 2022-09-04 DIAGNOSIS — J101 Influenza due to other identified influenza virus with other respiratory manifestations: Secondary | ICD-10-CM | POA: Diagnosis not present

## 2022-09-04 DIAGNOSIS — I10 Essential (primary) hypertension: Secondary | ICD-10-CM | POA: Diagnosis not present

## 2022-09-04 DIAGNOSIS — Z8546 Personal history of malignant neoplasm of prostate: Secondary | ICD-10-CM | POA: Insufficient documentation

## 2022-09-04 DIAGNOSIS — E119 Type 2 diabetes mellitus without complications: Secondary | ICD-10-CM | POA: Diagnosis not present

## 2022-09-04 DIAGNOSIS — R1110 Vomiting, unspecified: Secondary | ICD-10-CM | POA: Diagnosis present

## 2022-09-04 DIAGNOSIS — Z794 Long term (current) use of insulin: Secondary | ICD-10-CM | POA: Diagnosis not present

## 2022-09-04 DIAGNOSIS — R059 Cough, unspecified: Secondary | ICD-10-CM | POA: Diagnosis not present

## 2022-09-04 LAB — COMPREHENSIVE METABOLIC PANEL
ALT: 23 U/L (ref 0–44)
AST: 26 U/L (ref 15–41)
Albumin: 4.3 g/dL (ref 3.5–5.0)
Alkaline Phosphatase: 83 U/L (ref 38–126)
Anion gap: 11 (ref 5–15)
BUN: 12 mg/dL (ref 8–23)
CO2: 22 mmol/L (ref 22–32)
Calcium: 9.7 mg/dL (ref 8.9–10.3)
Chloride: 97 mmol/L — ABNORMAL LOW (ref 98–111)
Creatinine, Ser: 1.15 mg/dL (ref 0.61–1.24)
GFR, Estimated: >60 mL/min (ref >60)
Glucose, Bld: 227 mg/dL — ABNORMAL HIGH (ref 70–99)
Potassium: 3.7 mmol/L (ref 3.5–5.1)
Sodium: 130 mmol/L — ABNORMAL LOW (ref 135–145)
Total Bilirubin: 0.8 mg/dL (ref 0.3–1.2)
Total Protein: 8.7 g/dL — ABNORMAL HIGH (ref 6.5–8.1)

## 2022-09-04 LAB — CBC
HCT: 38.7 % — ABNORMAL LOW (ref 39.0–52.0)
Hemoglobin: 13.2 g/dL (ref 13.0–17.0)
MCH: 31.5 pg (ref 26.0–34.0)
MCHC: 34.1 g/dL (ref 30.0–36.0)
MCV: 92.4 fL (ref 80.0–100.0)
Platelets: 193 10*3/uL (ref 150–400)
RBC: 4.19 MIL/uL — ABNORMAL LOW (ref 4.22–5.81)
RDW: 12.4 % (ref 11.5–15.5)
WBC: 4.7 10*3/uL (ref 4.0–10.5)
nRBC: 0 % (ref 0.0–0.2)

## 2022-09-04 LAB — RESP PANEL BY RT-PCR (RSV, FLU A&B, COVID)  RVPGX2
Influenza A by PCR: POSITIVE — AB
Influenza B by PCR: NEGATIVE
Resp Syncytial Virus by PCR: NEGATIVE
SARS Coronavirus 2 by RT PCR: NEGATIVE

## 2022-09-04 LAB — LIPASE, BLOOD: Lipase: 30 U/L (ref 11–51)

## 2022-09-04 MED ORDER — ONDANSETRON 4 MG PO TBDP
4.0000 mg | ORAL_TABLET | Freq: Once | ORAL | Status: AC
  Administered 2022-09-04: 4 mg via ORAL
  Filled 2022-09-04: qty 1

## 2022-09-04 MED ORDER — ACETAMINOPHEN 500 MG PO TABS
1000.0000 mg | ORAL_TABLET | Freq: Once | ORAL | Status: AC
  Administered 2022-09-04: 1000 mg via ORAL
  Filled 2022-09-04: qty 2

## 2022-09-04 NOTE — ED Triage Notes (Signed)
Pt presents ambulatory to triage via POV with complaints of N/V that started today after starting pseudoephed DM last night. Pt states he's had several episodes of emesis - no hematemesis. A&Ox4 at this time. Denies CP or SOB.

## 2022-09-05 ENCOUNTER — Emergency Department
Admission: EM | Admit: 2022-09-05 | Discharge: 2022-09-05 | Disposition: A | Discharge status: Home / Self Care | Payer: No Typology Code available for payment source | Attending: Emergency Medicine | Admitting: Emergency Medicine

## 2022-09-05 ENCOUNTER — Other Ambulatory Visit: Payer: Self-pay

## 2022-09-05 DIAGNOSIS — J101 Influenza due to other identified influenza virus with other respiratory manifestations: Secondary | ICD-10-CM

## 2022-09-05 LAB — URINALYSIS, ROUTINE W REFLEX MICROSCOPIC
Bacteria, UA: NONE SEEN
Bilirubin Urine: NEGATIVE
Glucose, UA: 150 mg/dL — AB
Ketones, ur: 5 mg/dL — AB
Leukocytes,Ua: NEGATIVE
Nitrite: NEGATIVE
Protein, ur: 30 mg/dL — AB
Specific Gravity, Urine: 1.008 (ref 1.005–1.030)
pH: 6 (ref 5.0–8.0)

## 2022-09-05 MED ORDER — LACTATED RINGERS IV BOLUS
1000.0000 mL | Freq: Once | INTRAVENOUS | Status: AC
  Administered 2022-09-05: 1000 mL via INTRAVENOUS

## 2022-09-05 MED ORDER — ONDANSETRON 4 MG PO TBDP: 4.0000 mg | ORAL_TABLET | Freq: Three times a day (TID) | ORAL | 0 refills | Status: AC | PRN

## 2022-09-05 MED ORDER — OSELTAMIVIR PHOSPHATE 30 MG PO CAPS
30.0000 mg | ORAL_CAPSULE | Freq: Once | ORAL | Status: AC
  Administered 2022-09-05: 30 mg via ORAL
  Filled 2022-09-05: qty 1

## 2022-09-05 MED ORDER — OSELTAMIVIR PHOSPHATE 30 MG PO CAPS: 30.0000 mg | ORAL_CAPSULE | Freq: Two times a day (BID) | ORAL | 0 refills | Status: AC

## 2022-09-05 NOTE — ED Provider Notes (Addendum)
[facility] Provider Note    Event Date/Time   First MD Initiated Contact with Patient 09/05/22 0205     (approximate)   History   Emesis and Cough   HPI  Henry Frazier is a 88 y.o. male  with pmh diabetes, hypertension, hyperlipidemia who presents because of cough and vomiting.  Patient notes that several weeks ago he had cough and bronchitis in the ER for this.  Overall was improving until yesterday when he developed increasing cough and has had some posttussive emesis.  Has not had fevers but some chills at home other than the posttussive emesis no vomiting.  He denies chest pain does have mild dyspnea.  He has a little bit of mid upper abdominal pain related to coughing and vomiting.  Denies diarrhea.  Still eating and drinking.  No urinary symptoms.  Patient took Sudafed DM last night on empty stomach and is concerned that that could have been causing the vomiting.    Past Medical History:  Diagnosis Date   Acid reflux    Arthritis    Diabetes mellitus without complication ([facility])    Heart murmur    Hypertension    Prostate cancer ([place])    Pure hypercholesterolemia     Patient Active Problem List   Diagnosis Date Noted   Influenza A 08/15/2018   Essential hypertension 09/12/2016   Mixed hyperlipidemia 09/12/2016   Gastroesophageal reflux disease without esophagitis 09/12/2016   Type 2 diabetes mellitus without complication, without long-term current use of insulin ([place]) 09/12/2016   Prostate cancer ([place]) 09/12/2016     Physical Exam  Triage Vital Signs: ED Triage Vitals  Enc Vitals Group     BP 09/04/22 2235 (!) 143/87     Pulse Rate 09/04/22 2235 (!) 101     Resp 09/04/22 2235 18     Temp 09/04/22 2235 (!) 101.1 F (38.4 C)     Temp src --      SpO2 09/04/22 2235 97 %     Weight 09/04/22 2233 155 lb (70.3 kg)     Height 09/04/22 2233 '5\' 6"'$  (1.676 m)     Head Circumference --      Peak Flow --      Pain Score 09/05/22 0216 4      Pain Loc --      Pain Edu? --      Excl. in [place]? --     Most recent vital signs: Vitals:   09/05/22 0432 09/05/22 0435  BP: (!) 146/79   Pulse: 96   Resp: (!) 26   Temp:  (!) 101 F (38.3 C)  SpO2: 96%      General: Awake, no distress.  CV:  Good peripheral perfusion.  Resp:  Normal effort.  Lung sounds are clear no increased work of breathing Abd:  No distention.  Abdomen is soft and nontender throughout Neuro:             Awake, Alert, Oriented x 3  Other:  Mucous membranes are moist   ED Results / Procedures / Treatments  Labs (all labs ordered are listed, but only abnormal results are displayed) Labs Reviewed  RESP PANEL BY RT-PCR (RSV, FLU A&B, COVID)  RVPGX2 - Abnormal; Notable for the following components:      Result Value   Influenza A by PCR POSITIVE (*)    All other components within normal limits  COMPREHENSIVE METABOLIC PANEL - Abnormal; Notable for the following components:  Sodium 130 (*)    Chloride 97 (*)    Glucose, Bld 227 (*)    Total Protein 8.7 (*)    All other components within normal limits  CBC - Abnormal; Notable for the following components:   RBC 4.19 (*)    HCT 38.7 (*)    All other components within normal limits  URINALYSIS, ROUTINE W REFLEX MICROSCOPIC - Abnormal; Notable for the following components:   Color, Urine STRAW (*)    APPearance CLEAR (*)    Glucose, UA 150 (*)    Hgb urine dipstick SMALL (*)    Ketones, ur 5 (*)    Protein, ur 30 (*)    All other components within normal limits  LIPASE, BLOOD     EKG  EKG reviewed and interpreted myself shows sinus rhythm with a first-degree AV block, normal QTc, no acute ischemic changes   RADIOLOGY I reviewed and interpreted the CXR which does not show any acute cardiopulmonary process    PROCEDURES:  Critical Care performed: No  .1-3 Lead EKG Interpretation  Performed by: Rada Hay, MD Authorized by: Rada Hay, MD     Interpretation: normal      ECG rate assessment: normal     Rhythm: sinus rhythm     Ectopy: none     Conduction: normal     The patient is on the cardiac monitor to evaluate for evidence of arrhythmia and/or significant heart rate changes.   MEDICATIONS ORDERED IN ED: Medications  ondansetron (ZOFRAN-ODT) disintegrating tablet 4 mg (4 mg Oral Given 09/04/22 2239)  acetaminophen (TYLENOL) tablet 1,000 mg (1,000 mg Oral Given 09/04/22 2239)  lactated ringers bolus 1,000 mL (1,000 mLs Intravenous New Bag/Given 09/05/22 0426)  oseltamivir (TAMIFLU) capsule 30 mg (30 mg Oral Given 09/05/22 0426)     IMPRESSION / MDM / ASSESSMENT AND PLAN / ED COURSE  I reviewed the triage vital signs and the nursing notes.                              Patient's presentation is most consistent with acute complicated illness / injury requiring diagnostic workup.  Differential diagnosis includes, but is not limited to, sepsis, pneumonia, viral illness including influenza, COVID-19, UTI, intra-abdominal infection  Patient is a 88 year old male with history of diabetes hypertension hyperlipidemia who presents because of cough posttussive emesis.  Symptoms started yesterday.  Prior to this several weeks ago had bronchitis-like illness that did not completely resolved but the symptoms clearly started yesterday.  He had increasing cough for which she took Sudafed DM and this caused some episodes of posttussive emesis.  He has some mild upper abdominal pain chills and dyspnea.  He is febrile to 101 on arrival tachycardic and tachypneic but saturating well.  Patient looks quite well for his age mucous membranes are moist lungs are clear abdominal exam is benign there is no tenderness.  Chest x-ray does not show infiltrate.  No leukocytosis.  Mildly hyperglycemic to 227 mildly hyponatremic 130.  UA does not suggest infection.  He has influenza A positive which I think explains his symptoms.  Patient received ODT Zofran in the ER and did not have any  further episodes of vomiting and did not have any ongoing nausea.  Heart rate improved after Tylenol alone.  I did give him a bolus of fluid as well.  Given his age and comorbidities and the fact that symptoms have been for  less than 48 hours I think that he would benefit from Tamiflu despite the GI symptoms.  Will discharge with prescription for Zofran in case he has standing vomiting from the Tamiflu.  Patient's EKG was read by the computer as prolonged QTc interval to 560 but this is actually due to a first-degree AV block and so I think it safe to give him the Zofran.  Given patient's vital signs have normalized he is not hypoxic and is still well-appearing and functional for his age I do think that he is appropriate to be discharged.  Patient and daughter at bedside are in agreement.       FINAL CLINICAL IMPRESSION(S) / ED DIAGNOSES   Final diagnoses:  Influenza A     Rx / DC Orders   ED Discharge Orders          Ordered    oseltamivir (TAMIFLU) 30 MG capsule  2 times daily        09/05/22 0540    ondansetron (ZOFRAN-ODT) 4 MG disintegrating tablet  Every 8 hours PRN        09/05/22 0541             Note:  This document was prepared using Dragon voice recognition software and may include unintentional dictation errors.   Rada Hay, MD 09/05/22 [phone]    Rada Hay, MD 09/05/22 [phone]

## 2022-09-05 NOTE — Discharge Instructions (Signed)
You tested positive for influenza.  Please take the Tamiflu twice a day for the next 5 days.  If you develop nausea vomiting you can take the dissolvable Zofran.  Please make sure you are staying hydrated.  If you develop worsening shortness of breath confusion not able to keep fluids down please return to the emergency department.

## 2023-08-13 DIAGNOSIS — E782 Mixed hyperlipidemia: Secondary | ICD-10-CM | POA: Diagnosis not present

## 2023-08-13 DIAGNOSIS — Z79899 Other long term (current) drug therapy: Secondary | ICD-10-CM | POA: Diagnosis not present

## 2023-08-13 DIAGNOSIS — I10 Essential (primary) hypertension: Secondary | ICD-10-CM | POA: Diagnosis not present

## 2023-08-13 DIAGNOSIS — E118 Type 2 diabetes mellitus with unspecified complications: Secondary | ICD-10-CM | POA: Diagnosis not present

## 2023-08-20 DIAGNOSIS — Z79899 Other long term (current) drug therapy: Secondary | ICD-10-CM | POA: Diagnosis not present

## 2023-08-20 DIAGNOSIS — D649 Anemia, unspecified: Secondary | ICD-10-CM | POA: Diagnosis not present

## 2023-08-20 DIAGNOSIS — Z0000 Encounter for general adult medical examination without abnormal findings: Secondary | ICD-10-CM | POA: Diagnosis not present

## 2023-08-20 DIAGNOSIS — I10 Essential (primary) hypertension: Secondary | ICD-10-CM | POA: Diagnosis not present

## 2023-08-20 DIAGNOSIS — E119 Type 2 diabetes mellitus without complications: Secondary | ICD-10-CM | POA: Diagnosis not present

## 2023-08-20 DIAGNOSIS — E785 Hyperlipidemia, unspecified: Secondary | ICD-10-CM | POA: Diagnosis not present

## 2023-09-09 DIAGNOSIS — R051 Acute cough: Secondary | ICD-10-CM | POA: Diagnosis not present

## 2023-09-09 DIAGNOSIS — E119 Type 2 diabetes mellitus without complications: Secondary | ICD-10-CM | POA: Diagnosis not present

## 2023-10-23 DIAGNOSIS — R32 Unspecified urinary incontinence: Secondary | ICD-10-CM | POA: Diagnosis not present

## 2023-10-23 DIAGNOSIS — R918 Other nonspecific abnormal finding of lung field: Secondary | ICD-10-CM | POA: Diagnosis not present

## 2023-10-23 DIAGNOSIS — R0600 Dyspnea, unspecified: Secondary | ICD-10-CM | POA: Diagnosis not present

## 2023-10-23 DIAGNOSIS — E119 Type 2 diabetes mellitus without complications: Secondary | ICD-10-CM | POA: Diagnosis not present

## 2023-10-23 DIAGNOSIS — I10 Essential (primary) hypertension: Secondary | ICD-10-CM | POA: Diagnosis not present

## 2023-10-23 DIAGNOSIS — I440 Atrioventricular block, first degree: Secondary | ICD-10-CM | POA: Diagnosis not present

## 2023-10-23 DIAGNOSIS — Z859 Personal history of malignant neoplasm, unspecified: Secondary | ICD-10-CM | POA: Diagnosis not present

## 2023-10-23 DIAGNOSIS — J40 Bronchitis, not specified as acute or chronic: Secondary | ICD-10-CM | POA: Diagnosis not present

## 2023-10-24 DIAGNOSIS — R32 Unspecified urinary incontinence: Secondary | ICD-10-CM | POA: Diagnosis not present

## 2023-10-28 DIAGNOSIS — I440 Atrioventricular block, first degree: Secondary | ICD-10-CM | POA: Diagnosis not present

## 2023-10-28 DIAGNOSIS — R059 Cough, unspecified: Secondary | ICD-10-CM | POA: Diagnosis not present

## 2023-10-31 DIAGNOSIS — R9431 Abnormal electrocardiogram [ECG] [EKG]: Secondary | ICD-10-CM | POA: Diagnosis not present

## 2023-10-31 DIAGNOSIS — E7800 Pure hypercholesterolemia, unspecified: Secondary | ICD-10-CM | POA: Diagnosis not present

## 2023-10-31 DIAGNOSIS — I440 Atrioventricular block, first degree: Secondary | ICD-10-CM | POA: Diagnosis not present

## 2023-10-31 DIAGNOSIS — I10 Essential (primary) hypertension: Secondary | ICD-10-CM | POA: Diagnosis not present

## 2023-11-12 DIAGNOSIS — E118 Type 2 diabetes mellitus with unspecified complications: Secondary | ICD-10-CM | POA: Diagnosis not present

## 2023-11-12 DIAGNOSIS — Z79899 Other long term (current) drug therapy: Secondary | ICD-10-CM | POA: Diagnosis not present

## 2023-11-18 DIAGNOSIS — E119 Type 2 diabetes mellitus without complications: Secondary | ICD-10-CM | POA: Diagnosis not present

## 2023-11-18 DIAGNOSIS — D649 Anemia, unspecified: Secondary | ICD-10-CM | POA: Diagnosis not present

## 2023-11-18 DIAGNOSIS — Z79899 Other long term (current) drug therapy: Secondary | ICD-10-CM | POA: Diagnosis not present

## 2023-11-18 DIAGNOSIS — I10 Essential (primary) hypertension: Secondary | ICD-10-CM | POA: Diagnosis not present

## 2023-11-18 DIAGNOSIS — E782 Mixed hyperlipidemia: Secondary | ICD-10-CM | POA: Diagnosis not present

## 2023-12-02 DIAGNOSIS — Z79899 Other long term (current) drug therapy: Secondary | ICD-10-CM | POA: Diagnosis not present

## 2024-02-19 DIAGNOSIS — E538 Deficiency of other specified B group vitamins: Secondary | ICD-10-CM | POA: Diagnosis not present

## 2024-02-19 DIAGNOSIS — E119 Type 2 diabetes mellitus without complications: Secondary | ICD-10-CM | POA: Diagnosis not present

## 2024-02-19 DIAGNOSIS — Z125 Encounter for screening for malignant neoplasm of prostate: Secondary | ICD-10-CM | POA: Diagnosis not present

## 2024-02-19 DIAGNOSIS — D649 Anemia, unspecified: Secondary | ICD-10-CM | POA: Diagnosis not present

## 2024-02-19 DIAGNOSIS — E782 Mixed hyperlipidemia: Secondary | ICD-10-CM | POA: Diagnosis not present

## 2024-02-19 DIAGNOSIS — Z79899 Other long term (current) drug therapy: Secondary | ICD-10-CM | POA: Diagnosis not present

## 2024-02-19 DIAGNOSIS — I10 Essential (primary) hypertension: Secondary | ICD-10-CM | POA: Diagnosis not present

## 2024-02-25 DIAGNOSIS — E538 Deficiency of other specified B group vitamins: Secondary | ICD-10-CM | POA: Diagnosis not present

## 2024-03-02 DIAGNOSIS — E119 Type 2 diabetes mellitus without complications: Secondary | ICD-10-CM | POA: Diagnosis not present

## 2024-03-02 DIAGNOSIS — H524 Presbyopia: Secondary | ICD-10-CM | POA: Diagnosis not present

## 2024-03-29 DIAGNOSIS — Z79899 Other long term (current) drug therapy: Secondary | ICD-10-CM | POA: Diagnosis not present

## 2024-03-29 DIAGNOSIS — E538 Deficiency of other specified B group vitamins: Secondary | ICD-10-CM | POA: Diagnosis not present

## 2024-03-29 DIAGNOSIS — D649 Anemia, unspecified: Secondary | ICD-10-CM | POA: Diagnosis not present

## 2024-03-29 DIAGNOSIS — R899 Unspecified abnormal finding in specimens from other organs, systems and tissues: Secondary | ICD-10-CM | POA: Diagnosis not present

## 2024-04-29 DIAGNOSIS — E538 Deficiency of other specified B group vitamins: Secondary | ICD-10-CM | POA: Diagnosis not present

## 2024-05-24 DIAGNOSIS — E782 Mixed hyperlipidemia: Secondary | ICD-10-CM | POA: Diagnosis not present

## 2024-05-24 DIAGNOSIS — E119 Type 2 diabetes mellitus without complications: Secondary | ICD-10-CM | POA: Diagnosis not present

## 2024-05-24 DIAGNOSIS — E538 Deficiency of other specified B group vitamins: Secondary | ICD-10-CM | POA: Diagnosis not present

## 2024-05-24 DIAGNOSIS — I10 Essential (primary) hypertension: Secondary | ICD-10-CM | POA: Diagnosis not present

## 2024-05-24 DIAGNOSIS — K219 Gastro-esophageal reflux disease without esophagitis: Secondary | ICD-10-CM | POA: Diagnosis not present

## 2024-05-24 DIAGNOSIS — D649 Anemia, unspecified: Secondary | ICD-10-CM | POA: Diagnosis not present

## 2024-05-24 DIAGNOSIS — Z79899 Other long term (current) drug therapy: Secondary | ICD-10-CM | POA: Diagnosis not present
